# Patient Record
Sex: Female | Born: 1965
Health system: Southern US, Community
[De-identification: ages and names within clinical notes are randomized; demographics above are authoritative.]

## PROBLEM LIST (undated history)

## (undated) DIAGNOSIS — T7840XA Allergy, unspecified, initial encounter: Secondary | ICD-10-CM

## (undated) DIAGNOSIS — F329 Major depressive disorder, single episode, unspecified: Secondary | ICD-10-CM

## (undated) DIAGNOSIS — I10 Essential (primary) hypertension: Secondary | ICD-10-CM

## (undated) DIAGNOSIS — E7800 Pure hypercholesterolemia, unspecified: Secondary | ICD-10-CM

## (undated) DIAGNOSIS — F419 Anxiety disorder, unspecified: Secondary | ICD-10-CM

## (undated) DIAGNOSIS — R7303 Prediabetes: Secondary | ICD-10-CM

## (undated) DIAGNOSIS — M1990 Unspecified osteoarthritis, unspecified site: Secondary | ICD-10-CM

## (undated) DIAGNOSIS — F32A Depression, unspecified: Secondary | ICD-10-CM

## (undated) HISTORY — DX: Anxiety disorder, unspecified: F41.9

## (undated) HISTORY — DX: Allergy, unspecified, initial encounter: T78.40XA

## (undated) HISTORY — PX: TUBAL LIGATION: SHX77

## (undated) HISTORY — DX: Prediabetes: R73.03

## (undated) HISTORY — DX: Unspecified osteoarthritis, unspecified site: M19.90

---

## 2015-03-01 ENCOUNTER — Encounter (HOSPITAL_COMMUNITY): Payer: Self-pay | Admitting: Emergency Medicine

## 2015-03-01 ENCOUNTER — Emergency Department (HOSPITAL_COMMUNITY)
Admission: EM | Admit: 2015-03-01 | Discharge: 2015-03-01 | Disposition: A | Discharge status: Home / Self Care | Payer: Self-pay | Attending: Emergency Medicine | Admitting: Emergency Medicine

## 2015-03-01 ENCOUNTER — Emergency Department (HOSPITAL_COMMUNITY): Payer: Self-pay

## 2015-03-01 DIAGNOSIS — R0602 Shortness of breath: Secondary | ICD-10-CM | POA: Insufficient documentation

## 2015-03-01 DIAGNOSIS — I10 Essential (primary) hypertension: Secondary | ICD-10-CM | POA: Insufficient documentation

## 2015-03-01 DIAGNOSIS — Z8659 Personal history of other mental and behavioral disorders: Secondary | ICD-10-CM | POA: Insufficient documentation

## 2015-03-01 DIAGNOSIS — Z8639 Personal history of other endocrine, nutritional and metabolic disease: Secondary | ICD-10-CM | POA: Insufficient documentation

## 2015-03-01 DIAGNOSIS — Z87891 Personal history of nicotine dependence: Secondary | ICD-10-CM | POA: Insufficient documentation

## 2015-03-01 DIAGNOSIS — Z79899 Other long term (current) drug therapy: Secondary | ICD-10-CM | POA: Insufficient documentation

## 2015-03-01 DIAGNOSIS — R202 Paresthesia of skin: Secondary | ICD-10-CM | POA: Insufficient documentation

## 2015-03-01 HISTORY — DX: Major depressive disorder, single episode, unspecified: F32.9

## 2015-03-01 HISTORY — DX: Depression, unspecified: F32.A

## 2015-03-01 HISTORY — DX: Pure hypercholesterolemia, unspecified: E78.00

## 2015-03-01 HISTORY — DX: Essential (primary) hypertension: I10

## 2015-03-01 LAB — CBC WITH DIFFERENTIAL/PLATELET
BASOS ABS: 0 10*3/uL (ref 0.0–0.1)
BASOS PCT: 0 %
EOS PCT: 2 %
Eosinophils Absolute: 0.2 10*3/uL (ref 0.0–0.7)
HCT: 40.7 % (ref 36.0–46.0)
Hemoglobin: 13.2 g/dL (ref 12.0–15.0)
Lymphocytes Relative: 30 %
Lymphs Abs: 2.8 10*3/uL (ref 0.7–4.0)
MCH: 27.7 pg (ref 26.0–34.0)
MCHC: 32.4 g/dL (ref 30.0–36.0)
MCV: 85.5 fL (ref 78.0–100.0)
MONO ABS: 0.4 10*3/uL (ref 0.1–1.0)
MONOS PCT: 4 %
Neutro Abs: 5.9 10*3/uL (ref 1.7–7.7)
Neutrophils Relative %: 64 %
PLATELETS: 278 10*3/uL (ref 150–400)
RBC: 4.76 MIL/uL (ref 3.87–5.11)
RDW: 13.9 % (ref 11.5–15.5)
WBC: 9.3 10*3/uL (ref 4.0–10.5)

## 2015-03-01 LAB — COMPREHENSIVE METABOLIC PANEL
ALBUMIN: 4 g/dL (ref 3.5–5.0)
ALT: 18 U/L (ref 14–54)
ANION GAP: 7 (ref 5–15)
AST: 21 U/L (ref 15–41)
Alkaline Phosphatase: 57 U/L (ref 38–126)
BILIRUBIN TOTAL: 1.2 mg/dL (ref 0.3–1.2)
BUN: 8 mg/dL (ref 6–20)
CHLORIDE: 102 mmol/L (ref 101–111)
CO2: 27 mmol/L (ref 22–32)
Calcium: 9.1 mg/dL (ref 8.9–10.3)
Creatinine, Ser: 0.82 mg/dL (ref 0.44–1.00)
GFR calc Af Amer: >60 mL/min (ref >60)
GLUCOSE: 96 mg/dL (ref 65–99)
POTASSIUM: 3.9 mmol/L (ref 3.5–5.1)
Sodium: 136 mmol/L (ref 135–145)
TOTAL PROTEIN: 7 g/dL (ref 6.5–8.1)

## 2015-03-01 LAB — I-STAT TROPONIN, ED
TROPONIN I, POC: 0 ng/mL (ref 0.00–0.08)
TROPONIN I, POC: 0 ng/mL (ref 0.00–0.08)

## 2015-03-01 NOTE — Discharge Instructions (Signed)
Cough, Adult Coughing is a reflex that clears your throat and your airways. Coughing helps to heal and protect your lungs. It is normal to cough occasionally, but a cough that happens with other symptoms or lasts a long time may be a sign of a condition that needs treatment. A cough may last only 2-3 weeks (acute), or it may last longer than 8 weeks (chronic). CAUSES Coughing is commonly caused by:  Breathing in substances that irritate your lungs.  A viral or bacterial respiratory infection.  Allergies.  Asthma.  Postnasal drip.  Smoking.  Acid backing up from the stomach into the esophagus (gastroesophageal reflux).  Certain medicines.  Chronic lung problems, including COPD (or rarely, lung cancer).  Other medical conditions such as heart failure. HOME CARE INSTRUCTIONS  Pay attention to any changes in your symptoms. Take these actions to help with your discomfort:  Take medicines only as told by your health care provider.  If you were prescribed an antibiotic medicine, take it as told by your health care provider. Do not stop taking the antibiotic even if you start to feel better.  Talk with your health care provider before you take a cough suppressant medicine.  Drink enough fluid to keep your urine clear or pale yellow.  If the air is dry, use a cold steam vaporizer or humidifier in your bedroom or your home to help loosen secretions.  Avoid anything that causes you to cough at work or at home.  If your cough is worse at night, try sleeping in a semi-upright position.  Avoid cigarette smoke. If you smoke, quit smoking. If you need help quitting, ask your health care provider.  Avoid caffeine.  Avoid alcohol.  Rest as needed. SEEK MEDICAL CARE IF:   You have new symptoms.  You cough up pus.  Your cough does not get better after 2-3 weeks, or your cough gets worse.  You cannot control your cough with suppressant medicines and you are losing sleep.  You  develop pain that is getting worse or pain that is not controlled with pain medicines.  You have a fever.  You have unexplained weight loss.  You have night sweats. SEEK IMMEDIATE MEDICAL CARE IF:  You cough up blood.  You have difficulty breathing.  Your heartbeat is very fast.   This information is not intended to replace advice given to you by your health care provider. Make sure you discuss any questions you have with your health care provider.   Document Released: 10/17/2010 Document Revised: 01/09/2015 Document Reviewed: 06/27/2014 Elsevier Interactive Patient Education 2016 [place] [employer] Shortness of breath means you have trouble breathing. Shortness of breath needs medical care right away. HOME CARE   Do not smoke.  Avoid being around chemicals or things (paint fumes, dust) that may bother your breathing.  Rest as needed. Slowly begin your normal activities.  Only take medicines as told by your doctor.  Keep all doctor visits as told. GET HELP RIGHT AWAY IF:   Your shortness of breath gets worse.  You feel lightheaded, pass out (faint), or have a cough that is not helped by medicine.  You cough up blood.  You have pain with breathing.  You have pain in your chest, arms, shoulders, or belly (abdomen).  You have a fever.  You cannot walk up stairs or exercise the way you normally do.  You do not get better in the time expected.  You have a hard time doing normal  activities even with rest.  You have problems with your medicines.  You have any new symptoms. MAKE SURE YOU:  Understand these instructions.  Will watch your condition.  Will get help right away if you are not doing well or get worse.   This information is not intended to replace advice given to you by your health care provider. Make sure you discuss any questions you have with your health care provider.   Document Released: 10/07/2007 Document Revised: 04/25/2013  Document Reviewed: 07/06/2011 Elsevier Interactive Patient Education 2016 Elsevier Inc[employer]  Peripheral Neuropathy Peripheral neuropathy is a type of nerve damage. It affects nerves that carry signals between the spinal cord and other parts of the body. These are called peripheral nerves. With peripheral neuropathy, one nerve or a group of nerves may be damaged.  CAUSES  Many things can damage peripheral nerves. For some people with peripheral neuropathy, the cause is unknown. Some causes include:  Diabetes. This is the most common cause of peripheral neuropathy.  Injury to a nerve.  Pressure or stress on a nerve that lasts a long time.  Too little vitamin B. Alcoholism can lead to this.  Infections.  Autoimmune diseases, such as multiple sclerosis and systemic lupus erythematosus.  Inherited nerve diseases.  Some medicines, such as cancer drugs.  Toxic substances, such as lead and mercury.  Too little blood flowing to the legs.  Kidney disease.  Thyroid disease. SIGNS AND SYMPTOMS  Different people have different symptoms. The symptoms you have will depend on which of your nerves is damaged. Common symptoms include:  Loss of feeling (numbness) in the feet and hands.  Tingling in the feet and hands.  Pain that burns.  Very sensitive skin.  Weakness.  Not being able to move a part of the body (paralysis).  Muscle twitching.  Clumsiness or poor coordination.  Loss of balance.  Not being able to control your bladder.  Feeling dizzy.  Sexual problems. DIAGNOSIS  Peripheral neuropathy is a symptom, not a disease. Finding the cause of peripheral neuropathy can be hard. To figure that out, your health care provider will take a medical history and do a physical exam. A neurological exam will also be done. This involves checking things affected by your brain, spinal cord, and nerves (nervous system). For example, your health care provider will check your reflexes, how  you move, and what you can feel.  Other types of tests may also be ordered, such as:  Blood tests.  A test of the fluid in your spinal cord.  Imaging tests, such as CT scans or an MRI.  Electromyography (EMG). This test checks the nerves that control muscles.  Nerve conduction velocity tests. These tests check how fast messages pass through your nerves.  Nerve biopsy. A small piece of nerve is removed. It is then checked under a microscope. TREATMENT   Medicine is often used to treat peripheral neuropathy. Medicines may include:  Pain-relieving medicines. Prescription or over-the-counter medicine may be suggested.  Antiseizure medicine. This may be used for pain.  Antidepressants. These also may help ease pain from neuropathy.  Lidocaine. This is a numbing medicine. You might wear a patch or be given a shot.  Mexiletine. This medicine is typically used to help control irregular heart rhythms.  Surgery. Surgery may be needed to relieve pressure on a nerve or to destroy a nerve that is causing pain.  Physical therapy to help movement.  Assistive devices to help movement. HOME CARE INSTRUCTIONS  Only take over-the-counter or prescription medicines as directed by your health care provider. Follow the instructions carefully for any given medicines. Do not take any other medicines without first getting approval from your health care provider.  If you have diabetes, work closely with your health care provider to keep your blood sugar under control.  If you have numbness in your feet:  Check every day for signs of injury or infection. Watch for redness, warmth, and swelling.  Wear padded socks and comfortable shoes. These help protect your feet.  Do not do things that put pressure on your damaged nerve.  Do not smoke. Smoking keeps blood from getting to damaged nerves.  Avoid or limit alcohol. Too much alcohol can cause a lack of B vitamins. These vitamins are needed for  healthy nerves.  Develop a good support system. Coping with peripheral neuropathy can be stressful. Talk to a mental health specialist or join a support group if you are struggling.  Follow up with your health care provider as directed. SEEK MEDICAL CARE IF:   You have new signs or symptoms of peripheral neuropathy.  You are struggling emotionally from dealing with peripheral neuropathy.  You have a fever. SEEK IMMEDIATE MEDICAL CARE IF:   You have an injury or infection that is not healing.  You feel very dizzy or begin vomiting.  You have chest pain.  You have trouble breathing.   This information is not intended to replace advice given to you by your health care provider. Make sure you discuss any questions you have with your health care provider.   Document Released: 04/10/2002 Document Revised: 12/31/2010 Document Reviewed: 12/26/2012 Elsevier Interactive Patient Education [employer] Elsevier Inc.

## 2015-03-01 NOTE — ED Provider Notes (Signed)
Arrival Date & Time: 03/01/15 & 1225 History  HPI Limitations: none. Chief Complaint  Patient presents with  . Numbness   HPI Tara Walton is a 49 y.o. female with a chief complaint of Numbness  Patient presents for sensation of shortness of breath nausea and paresthesias in left arm that occurred approximately around 5 AM this morning. Woke up from sleep due to symptomatic endorsements. No emesis or diaphoresis. Upon arrival in the emergency department states that all symptoms have resolved except for mild shortness of breath. During my encounter with the patient patient had return of paresthesias over the distal left upper extremity in a ulnar distribution of the hand. States that chest pain occurs during inspiration. Patient longer smokes denies fevers or chills or cough.  Past Medical History  I reviewed & agree with nursing's documentation on PMHx, PSHx, SHx and FHx. Past Medical History  Diagnosis Date  . Hypertension   . Hypercholesteremia   . Depression    No past surgical history on file. Social History   Social History  . Marital Status: Married    Spouse Name: N/A  . Number of Children: N/A  . Years of Education: N/A   Social History Main Topics  . Smoking status: Former Smoker    Quit date: 02/29/2012  . Smokeless tobacco: Not on file  . Alcohol Use: Not on file  . Drug Use: Not on file  . Sexual Activity: Not on file   Other Topics Concern  . Not on file   Social History Narrative  . No narrative on file   No family history on file.  Review of Systems  Complete ROS obtained and pertinent positive and negatives documented above in HPI. All other ROS negative.  Allergies  Hydrochlorothiazide  Home Medications   Prior to Admission medications   Medication Sig Start Date End Date Taking? Authorizing Provider  lisinopril (PRINIVIL,ZESTRIL) 10 MG tablet Take 10 mg by mouth daily.   Yes Historical Provider, MD    Physical Exam  BP 115/67 mmHg  Pulse  70  Temp(Src) 98.1 F (36.7 C) (Oral)  Resp 14  Ht  (1.626 m)  Wt 190 lb (86.183 kg)  BMI 32.60 kg/m2  SpO2 97%  LMP 02/03/2015 (Approximate) Physical Exam  Constitutional: She is oriented to person, place, and time. She appears well-developed and well-nourished.  Non-toxic appearance. She does not appear ill. No distress.  HENT:  Head: Normocephalic and atraumatic.  Right Ear: External ear normal.  Left Ear: External ear normal.  Eyes: Pupils are equal, round, and reactive to light. No scleral icterus.  Neck: Normal range of motion. Neck supple. No tracheal deviation present.  Cardiovascular: Normal heart sounds and intact distal pulses.   No murmur heard. Pulmonary/Chest: Effort normal and breath sounds normal. No stridor. No respiratory distress. She has no wheezes. She has no rales.  Abdominal: Soft. Bowel sounds are normal. She exhibits no distension. There is no tenderness. There is no rebound and no guarding.  Musculoskeletal: Normal range of motion.  Neurological: She is alert and oriented to person, place, and time. She has normal strength and normal reflexes. No cranial nerve deficit or sensory deficit.  Paresthesias in ulnar distribution of left hand. Strength in all extremities 5/5.  Skin: Skin is warm and dry. No pallor.  Psychiatric: She has a normal mood and affect. Her behavior is normal.  Nursing note and vitals reviewed.  ED Course  Procedures Labs Review Labs Reviewed  CBC WITH DIFFERENTIAL/PLATELET  COMPREHENSIVE METABOLIC PANEL  I-STAT TROPOININ, ED  I-STAT TROPOININ, ED   Imaging Review No results found.  Laboratory and Imaging results were personally reviewed by myself and used in the medical decision making of this patient's treatment and disposition.  EKG Interpretation  EKG Interpretation  Date/Time:  Friday March 01 2015 15:26:07 EDT Ventricular Rate:  76 PR Interval:  156 QRS Duration: 83 QT Interval:  387 QTC Calculation: 435 R  Axis:   70 Text Interpretation:  Sinus rhythm Consider left atrial enlargement Low voltage, precordial leads ED PHYSICIAN INTERPRETATION AVAILABLE IN [facility] Confirmed by TEST, Record ([id number]) on 03/02/2015 10:39:30 AM      MDM  Tara Walton is a 49 y.o. female with H&P as above. ED clinical course as follows:  Patient with PMHx remarkable for HTN and HLD. Patient denies prior clotting disorders or ever DVT or PE and denies CAD.  Patient presents with endorsements atypical for cardiac etiology.  VS stable. Without evidence of cardiopulmonary instability.  Dissection unlikely given absence of remarkable neurovascular deficits. Paresthesias fits ulnar neuropathy distribution. No other neurologic findings that would suggest Intracranial etiology and patient is hemodynamic stabile. No prior CVA/TIA hx.   Patient's mental status is appropriate. Cranial nerves remain grossly intact. Baseline strength and sensation in upper and lower extremities symmetrically. Coordination and gait intact. Therefore do not suspect Neurologic etiology. TIA unlikely due to symptoms not following vascular distribution.   Abdominal exam reveals no concerns, do not suspect as etiology.  Initial ECG reveals Sinus rhythm Consider left atrial enlargement without evidence of STEMI or STEMI equivalent.  Troponin sent, which were negative x 2.  Labs reveal no acute abnormalities requiring intervention or that would be the likely culprit of today's symptoms.  My visualization of Imaging revealed no evidence of acute cardiopulmonary disease. Specifically no PTX or evidence of PNA.  Patient does not have clinical signs or symptoms of DVT. I do not believe PE is the most likely diagnosis, nor do I believe PE is equally as likely in this patient's case. The patient's heart rate is below 100, the patient has not been immobilized, and the patient has not had surgery in the previous 4 weeks. The patient is not displaying  hemoptysis and has not suffered from a malignancy within the last 6 months. Therefore will not obtain d dimer at this time.  Patient required no interventions.  ACS/MI, PTX, PNA, Aortic Dissection, Pulmonary Embolism and CVA/TIA were considered in the evaluation of the patient during their encounter today.  I explained the differential diagnoses along with likely diagnosis and have given explicit precautions to return to the ER including any other new or worsening symptoms. The patient understands, and I both nursing and I confirmed there are no further concerns or questions. Discharge instructions concerning symptomatic care and follow up have been given. The patient is STABLE and is discharged to home in good condition.   Clinical Impression:  1. Paresthesia   2. SOB (shortness of breath)    Patient care discussed with Dr. Dalene SeltzerSchlossman, who oversaw their evaluation & treatment & voiced agreement. House Officer: Jonette EvaBrad Arlena Marsan, MD, Emergency Medicine.  Jonette EvaBrad Aaryn Parrilla, MD 03/04/15 [id number]  Alvira MondayErin Schlossman, MD 03/04/15 [phone]

## 2015-03-01 NOTE — ED Notes (Signed)
Patient transported to X-ray without distress.  

## 2015-03-01 NOTE — ED Notes (Addendum)
PT feeling SOB and nauseated; started around 0500 this morning woke her up from sleep; believes it to be panic attack. Left arm became numb at that time and felt nauseated. No vomiting. States it comes and goes. Felt chest pain upon deep inspiration; former smoker; denies fevers. States she has had many stressors lately with family.

## 2016-02-05 ENCOUNTER — Encounter: Payer: Self-pay | Admitting: Emergency Medicine

## 2016-02-05 ENCOUNTER — Emergency Department (HOSPITAL_COMMUNITY)
Admission: EM | Admit: 2016-02-05 | Discharge: 2016-02-05 | Disposition: A | Discharge status: Home / Self Care | Payer: Self-pay | Attending: Emergency Medicine | Admitting: Emergency Medicine

## 2016-02-05 DIAGNOSIS — Z87891 Personal history of nicotine dependence: Secondary | ICD-10-CM | POA: Insufficient documentation

## 2016-02-05 DIAGNOSIS — E876 Hypokalemia: Secondary | ICD-10-CM

## 2016-02-05 DIAGNOSIS — I10 Essential (primary) hypertension: Secondary | ICD-10-CM | POA: Insufficient documentation

## 2016-02-05 LAB — CBC WITH DIFFERENTIAL/PLATELET
BASOS ABS: 0 10*3/uL (ref 0.0–0.1)
BASOS PCT: 0 %
EOS ABS: 0.2 10*3/uL (ref 0.0–0.7)
EOS PCT: 1 %
HCT: 41.2 % (ref 36.0–46.0)
Hemoglobin: 13.6 g/dL (ref 12.0–15.0)
LYMPHS PCT: 24 %
Lymphs Abs: 3 10*3/uL (ref 0.7–4.0)
MCH: 28.2 pg (ref 26.0–34.0)
MCHC: 33 g/dL (ref 30.0–36.0)
MCV: 85.3 fL (ref 78.0–100.0)
MONO ABS: 0.6 10*3/uL (ref 0.1–1.0)
Monocytes Relative: 5 %
Neutro Abs: 8.4 10*3/uL — ABNORMAL HIGH (ref 1.7–7.7)
Neutrophils Relative %: 70 %
PLATELETS: 305 10*3/uL (ref 150–400)
RBC: 4.83 MIL/uL (ref 3.87–5.11)
RDW: 14 % (ref 11.5–15.5)
WBC: 12.2 10*3/uL — ABNORMAL HIGH (ref 4.0–10.5)

## 2016-02-05 LAB — COMPREHENSIVE METABOLIC PANEL
ALBUMIN: 3.7 g/dL (ref 3.5–5.0)
ALT: 26 U/L (ref 14–54)
AST: 23 U/L (ref 15–41)
Alkaline Phosphatase: 78 U/L (ref 38–126)
Anion gap: 12 (ref 5–15)
BUN: 17 mg/dL (ref 6–20)
CHLORIDE: 94 mmol/L — AB (ref 101–111)
CO2: 31 mmol/L (ref 22–32)
Calcium: 9.3 mg/dL (ref 8.9–10.3)
Creatinine, Ser: 1.13 mg/dL — ABNORMAL HIGH (ref 0.44–1.00)
GFR calc Af Amer: >60 mL/min (ref >60)
GFR, EST NON AFRICAN AMERICAN: 56 mL/min — AB (ref >60)
Glucose, Bld: 143 mg/dL — ABNORMAL HIGH (ref 65–99)
POTASSIUM: 2.5 mmol/L — AB (ref 3.5–5.1)
SODIUM: 137 mmol/L (ref 135–145)
Total Bilirubin: 0.9 mg/dL (ref 0.3–1.2)
Total Protein: 6.4 g/dL — ABNORMAL LOW (ref 6.5–8.1)

## 2016-02-05 MED ORDER — POTASSIUM CHLORIDE 10 MEQ/100ML IV SOLN
10.0000 meq | Freq: Once | INTRAVENOUS | Status: AC
  Administered 2016-02-05: 10 meq via INTRAVENOUS
  Filled 2016-02-05: qty 100

## 2016-02-05 MED ORDER — POTASSIUM CHLORIDE CRYS ER 20 MEQ PO TBCR: 20.0000 meq | EXTENDED_RELEASE_TABLET | Freq: Two times a day (BID) | ORAL | 0 refills | Status: DC

## 2016-02-05 MED ORDER — POTASSIUM CHLORIDE CRYS ER 20 MEQ PO TBCR
40.0000 meq | EXTENDED_RELEASE_TABLET | Freq: Once | ORAL | Status: AC
  Administered 2016-02-05: 40 meq via ORAL
  Filled 2016-02-05: qty 2

## 2016-02-05 MED ORDER — SODIUM CHLORIDE 0.9 % IV BOLUS (SEPSIS)
1000.0000 mL | Freq: Once | INTRAVENOUS | Status: AC
  Administered 2016-02-05: 1000 mL via INTRAVENOUS

## 2016-02-05 NOTE — ED Notes (Signed)
Denies concerns with dc 

## 2016-02-05 NOTE — ED Provider Notes (Signed)
[facility]-EMERGENCY DEPT Provider Note   CSN: [id number] Arrival date & time: 02/05/16  1316     History   Chief Complaint Chief Complaint  Patient presents with  . Fatigue    HPI Tara Walton is a 50 y.o. female.  Patient is a 50 year old female with history of hypertension. She presents for evaluation of weakness. She reports increased fatigue, having no energy, her legs cramping, and feeling poorly. She was seen last week by her PCP and had changes made to her blood pressure medications. She had previously been on lisinopril and this was changed to chlorthalidone. Since taking this medication she has developed the above symptoms. She denies any chest pain or shortness of breath. She denies any nausea, vomiting, or diarrhea. She denies any fevers or chills. She has since gone back to taking her lisinopril.   The history is provided by the patient.    Past Medical History:  Diagnosis Date  . Depression   . Hypercholesteremia   . Hypertension     There are no active problems to display for this patient.   No past surgical history on file.  OB History    No data available       Home Medications    Prior to Admission medications   Medication Sig Start Date End Date Taking? Authorizing Provider  lisinopril (PRINIVIL,ZESTRIL) 10 MG tablet Take 10 mg by mouth daily.    Historical Provider, MD    Family History No family history on file.  Social History Social History  Substance Use Topics  . Smoking status: Former Smoker    Quit date: 02/29/2012  . Smokeless tobacco: Not on file  . Alcohol use Not on file     Allergies   Hydrochlorothiazide   Review of Systems Review of Systems  All other systems reviewed and are negative.    Physical Exam Updated Vital Signs BP 118/60 (BP Location: Right Arm)   Pulse 75   Temp 97.5 F (36.4 C) (Oral)   SpO2 99%   Physical Exam  Constitutional: She is oriented to person, place, and time. She appears  well-developed and well-nourished. No distress.  HENT:  Head: Normocephalic and atraumatic.  Neck: Normal range of motion. Neck supple.  Cardiovascular: Normal rate and regular rhythm.  Exam reveals no gallop and no friction rub.   No murmur heard. Pulmonary/Chest: Effort normal and breath sounds normal. No respiratory distress. She has no wheezes.  Abdominal: Soft. Bowel sounds are normal. She exhibits no distension. There is no tenderness.  Musculoskeletal: Normal range of motion.  Neurological: She is alert and oriented to person, place, and time.  Skin: Skin is warm and dry. She is not diaphoretic.  Nursing note and vitals reviewed.    ED Treatments / Results  Labs (all labs ordered are listed, but only abnormal results are displayed) Labs Reviewed  CBC WITH DIFFERENTIAL/PLATELET - Abnormal; Notable for the following:       Result Value   WBC 12.2 (*)    Neutro Abs 8.4 (*)    All other components within normal limits  COMPREHENSIVE METABOLIC PANEL - Abnormal; Notable for the following:    Potassium 2.5 (*)    Chloride 94 (*)    Glucose, Bld 143 (*)    Creatinine, Ser 1.13 (*)    Total Protein 6.4 (*)    GFR calc non Af Amer 56 (*)    All other components within normal limits  URINALYSIS, ROUTINE W REFLEX MICROSCOPIC (NOT AT  [facility])    EKG  EKG Interpretation None       Radiology No results found.  Procedures Procedures (including critical care time)  Medications Ordered in ED Medications  sodium chloride 0.9 % bolus 1,000 mL (not administered)  potassium chloride 10 mEq in 100 mL IVPB (not administered)  potassium chloride 10 mEq in 100 mL IVPB (not administered)  potassium chloride SA (K-DUR,KLOR-CON) CR tablet 40 mEq (not administered)     Initial Impression / Assessment and Plan / ED Course  I have reviewed the triage vital signs and the nursing notes.  Pertinent labs & imaging results that were available during my care of the patient were reviewed by  me and considered in my medical decision making (see chart for details).  Clinical Course    Workup reveals a potassium of 2.5. She was given IV and oral potassium here in the ER and will be discharged with a short course of oral potassium at home. She is to follow-up with her primary Dr. in one week to have this level rechecked, and return to the ER if symptoms significantly worsen or change.  Final Clinical Impressions(s) / ED Diagnoses   Final diagnoses:  None    New Prescriptions New Prescriptions   No medications on file     Geoffery Lyons, MD 02/05/16 1713

## 2016-02-05 NOTE — ED Triage Notes (Addendum)
Pt c/o feeling weak and tired, recently had BP meds changed from lisinopril to chlorthalidone 1 week ago-- pt thinks that the med change has caused her to feel bad-- did take her lisinopril this am in stead of new med.   Pt has a rash on left lower side area. No blisters noted

## 2016-02-05 NOTE — Discharge Instructions (Signed)
Potassium as prescribed.  His follow-up with your primary Dr. in one week for a recheck of your potassium level, and return to the emergency department if your symptoms significantly worsen in the meantime.

## 2016-02-05 NOTE — ED Notes (Signed)
Reports generalized fatigue

## 2016-07-04 ENCOUNTER — Encounter (HOSPITAL_COMMUNITY): Payer: Self-pay | Admitting: *Deleted

## 2016-07-04 ENCOUNTER — Emergency Department (HOSPITAL_COMMUNITY)
Admission: EM | Admit: 2016-07-04 | Discharge: 2016-07-04 | Disposition: A | Discharge status: Home / Self Care | Payer: Self-pay | Attending: Emergency Medicine | Admitting: Emergency Medicine

## 2016-07-04 DIAGNOSIS — Z79899 Other long term (current) drug therapy: Secondary | ICD-10-CM | POA: Insufficient documentation

## 2016-07-04 DIAGNOSIS — I10 Essential (primary) hypertension: Secondary | ICD-10-CM | POA: Insufficient documentation

## 2016-07-04 DIAGNOSIS — Z87891 Personal history of nicotine dependence: Secondary | ICD-10-CM | POA: Insufficient documentation

## 2016-07-04 DIAGNOSIS — R1013 Epigastric pain: Secondary | ICD-10-CM | POA: Insufficient documentation

## 2016-07-04 DIAGNOSIS — Z7982 Long term (current) use of aspirin: Secondary | ICD-10-CM | POA: Insufficient documentation

## 2016-07-04 LAB — COMPREHENSIVE METABOLIC PANEL
ALK PHOS: 65 U/L (ref 38–126)
ALT: 28 U/L (ref 14–54)
ANION GAP: 8 (ref 5–15)
AST: 23 U/L (ref 15–41)
Albumin: 4.1 g/dL (ref 3.5–5.0)
BILIRUBIN TOTAL: 0.8 mg/dL (ref 0.3–1.2)
BUN: 9 mg/dL (ref 6–20)
CALCIUM: 9.9 mg/dL (ref 8.9–10.3)
CO2: 25 mmol/L (ref 22–32)
Chloride: 105 mmol/L (ref 101–111)
Creatinine, Ser: 0.75 mg/dL (ref 0.44–1.00)
Glucose, Bld: 99 mg/dL (ref 65–99)
Potassium: 3.9 mmol/L (ref 3.5–5.1)
SODIUM: 138 mmol/L (ref 135–145)
TOTAL PROTEIN: 7.1 g/dL (ref 6.5–8.1)

## 2016-07-04 LAB — CBC
HCT: 41.7 % (ref 36.0–46.0)
HEMOGLOBIN: 13.7 g/dL (ref 12.0–15.0)
MCH: 28.4 pg (ref 26.0–34.0)
MCHC: 32.9 g/dL (ref 30.0–36.0)
MCV: 86.3 fL (ref 78.0–100.0)
PLATELETS: 259 10*3/uL (ref 150–400)
RBC: 4.83 MIL/uL (ref 3.87–5.11)
RDW: 14.4 % (ref 11.5–15.5)
WBC: 8.8 10*3/uL (ref 4.0–10.5)

## 2016-07-04 LAB — URINALYSIS, ROUTINE W REFLEX MICROSCOPIC
Bilirubin Urine: NEGATIVE
GLUCOSE, UA: NEGATIVE mg/dL
KETONES UR: NEGATIVE mg/dL
NITRITE: NEGATIVE
Protein, ur: NEGATIVE mg/dL
Specific Gravity, Urine: 1.017 (ref 1.005–1.030)
pH: 5 (ref 5.0–8.0)

## 2016-07-04 LAB — LIPASE, BLOOD: Lipase: 15 U/L (ref 11–51)

## 2016-07-04 MED ORDER — OMEPRAZOLE 20 MG PO CPDR
20.0000 mg | DELAYED_RELEASE_CAPSULE | Freq: Every day | ORAL | 0 refills | Status: DC
Start: 2016-07-04 — End: 2022-12-17

## 2016-07-04 NOTE — ED Provider Notes (Signed)
[facility] Provider Note   CSN: [id number] Arrival date & time: 07/04/16  1151     History   Chief Complaint Chief Complaint  Patient presents with  . Abdominal Pain    HPI Tara Walton is a 51 y.o. female.  HPI Patient with abdominal pain. Has had it for the last week. Worse after eating. In her epigastric area. No nausea vomiting diarrhea. No fevers. No weight loss. No change in stool. Pain goes away between. No diaphoresis. She has not had pains like this before.   Past Medical History:  Diagnosis Date  . Depression   . Hypercholesteremia   . Hypertension     There are no active problems to display for this patient.   History reviewed. No pertinent surgical history.  OB History    No data available       Home Medications    Prior to Admission medications   Medication Sig Start Date End Date Taking? Authorizing Provider  aspirin 81 MG chewable tablet Chew 81 mg by mouth daily.   Yes Historical Provider, MD  lisinopril (PRINIVIL,ZESTRIL) 20 MG tablet Take 20 mg by mouth daily.   Yes Historical Provider, MD  omeprazole (PRILOSEC) 20 MG capsule Take 1 capsule (20 mg total) by mouth daily. 07/04/16   Benjiman CoreNathan Kinnick Maus, MD    Family History No family history on file.  Social History Social History  Substance Use Topics  . Smoking status: Former Smoker    Quit date: 02/29/2012  . Smokeless tobacco: Never Used  . Alcohol use No     Allergies   Hydrochlorothiazide   Review of Systems Review of Systems  Constitutional: Negative for appetite change.  HENT: Negative for congestion.   Gastrointestinal: Positive for abdominal pain, nausea and vomiting. Negative for constipation and diarrhea.  Genitourinary: Negative for flank pain.  Musculoskeletal: Negative for back pain.  Neurological: Negative for light-headedness.  Psychiatric/Behavioral: Negative for agitation.     Physical Exam Updated Vital Signs BP 157/84 (BP Location: Left Arm)    Pulse 78   Temp 97.8 F (36.6 C) (Oral)   Resp 18   Ht 5\' 5"  (1.651 m)   Wt 200 lb (90.7 kg)   SpO2 98%   BMI 33.28 kg/m   Physical Exam  Constitutional: She appears well-developed.  HENT:  Head: Normocephalic.  Eyes: Pupils are equal, round, and reactive to light.  Neck: Neck supple.  Cardiovascular: Normal rate.   Pulmonary/Chest: Effort normal.  Abdominal: There is tenderness.  Epigastric tenderness without rebound or guarding.  Musculoskeletal: She exhibits no edema.  Neurological: She is alert.  Skin: Skin is warm. Capillary refill takes less than 2 seconds.     ED Treatments / Results  Labs (all labs ordered are listed, but only abnormal results are displayed) Labs Reviewed  URINALYSIS, ROUTINE W REFLEX MICROSCOPIC - Abnormal; Notable for the following:       Result Value   APPearance CLOUDY (*)    Hgb urine dipstick SMALL (*)    Leukocytes, UA MODERATE (*)    Bacteria, UA FEW (*)    Squamous Epithelial / LPF 6-30 (*)    All other components within normal limits  LIPASE, BLOOD  COMPREHENSIVE METABOLIC PANEL  CBC    EKG  EKG Interpretation None       Radiology No results found.  Procedures Procedures (including critical care time)  Medications Ordered in ED Medications - No data to display   Initial Impression / Assessment and Plan /  ED Course  I have reviewed the triage vital signs and the nursing notes.  Pertinent labs & imaging results that were available during my care of the patient were reviewed by me and considered in my medical decision making (see chart for details).     Patient with epigastric abdominal pain. Worse after eating. No right upper quadrant tenderness. Labs reassuring. Discussed with patient. CT scan versus conservative treatment with omeprazole and follow-up. Patient has elected this time for the follow-up. Will have GI follow-up  Final Clinical Impressions(s) / ED Diagnoses   Final diagnoses:  Epigastric abdominal  pain    New Prescriptions New Prescriptions   OMEPRAZOLE (PRILOSEC) 20 MG CAPSULE    Take 1 capsule (20 mg total) by mouth daily.     Benjiman Core, MD 07/04/16 1630

## 2016-07-04 NOTE — ED Triage Notes (Signed)
Pt c/o abd pain onset x 1 wk, pt denies v/d, pt c/o nausea, pt denies GI hx, A&O x4

## 2017-01-06 DIAGNOSIS — N879 Dysplasia of cervix uteri, unspecified: Secondary | ICD-10-CM | POA: Insufficient documentation

## 2017-09-13 DIAGNOSIS — Z0000 Encounter for general adult medical examination without abnormal findings: Secondary | ICD-10-CM | POA: Diagnosis not present

## 2017-09-13 DIAGNOSIS — I10 Essential (primary) hypertension: Secondary | ICD-10-CM | POA: Diagnosis not present

## 2017-09-13 DIAGNOSIS — E119 Type 2 diabetes mellitus without complications: Secondary | ICD-10-CM | POA: Diagnosis not present

## 2017-09-13 DIAGNOSIS — E039 Hypothyroidism, unspecified: Secondary | ICD-10-CM | POA: Diagnosis not present

## 2017-09-13 DIAGNOSIS — E7800 Pure hypercholesterolemia, unspecified: Secondary | ICD-10-CM | POA: Diagnosis not present

## 2017-09-13 DIAGNOSIS — Z139 Encounter for screening, unspecified: Secondary | ICD-10-CM | POA: Diagnosis not present

## 2017-10-22 DIAGNOSIS — R52 Pain, unspecified: Secondary | ICD-10-CM | POA: Diagnosis not present

## 2017-10-22 DIAGNOSIS — M7732 Calcaneal spur, left foot: Secondary | ICD-10-CM | POA: Diagnosis not present

## 2017-12-10 ENCOUNTER — Ambulatory Visit (INDEPENDENT_AMBULATORY_CARE_PROVIDER_SITE_OTHER): Payer: BLUE CROSS/BLUE SHIELD

## 2017-12-10 ENCOUNTER — Ambulatory Visit (INDEPENDENT_AMBULATORY_CARE_PROVIDER_SITE_OTHER): Payer: BLUE CROSS/BLUE SHIELD | Admitting: Orthopaedic Surgery

## 2017-12-10 ENCOUNTER — Encounter (INDEPENDENT_AMBULATORY_CARE_PROVIDER_SITE_OTHER): Payer: Self-pay | Admitting: Orthopaedic Surgery

## 2017-12-10 VITALS — BP 157/82 | HR 74 | Ht 65.0 in | Wt 214.0 lb

## 2017-12-10 DIAGNOSIS — M25572 Pain in left ankle and joints of left foot: Secondary | ICD-10-CM

## 2017-12-10 MED ORDER — METHYLPREDNISOLONE ACETATE 40 MG/ML IJ SUSP
40.0000 mg | INTRAMUSCULAR | Status: AC | PRN
  Administered 2017-12-10: 40 mg

## 2017-12-10 MED ORDER — LIDOCAINE HCL 1 % IJ SOLN
1.0000 mL | INTRAMUSCULAR | Status: AC | PRN
  Administered 2017-12-10: 1 mL

## 2017-12-10 MED ORDER — BUPIVACAINE HCL 0.5 % IJ SOLN
1.0000 mL | INTRAMUSCULAR | Status: AC | PRN
  Administered 2017-12-10: 1 mL

## 2017-12-10 NOTE — Progress Notes (Signed)
Office Visit Note   Patient: Tara Walton           Date of Birth: 11-09-1965           MRN: [id number] Visit Date: 12/10/2017              Requested by: Malva Limes, MD [address] [place], [place] [address] PCP: Malva Limes, MD   Assessment & Plan: Visit Diagnoses:  1. Pain in left ankle and joints of left foot     Plan: Chronic plantar fasciitis left foot.  Long discussion regarding diagnosis and treatment options.  Will inject with cortisone and monitor response.  Return as needed.  Continue with icing stretching and comfortable shoes  Follow-Up Instructions: Return if symptoms worsen or fail to improve.   Orders:  Orders Placed This Encounter  Procedures  . Foot Inj  . XR Foot Complete Left   No orders of the defined types were placed in this encounter.     Procedures: Foot Inj Date/Time: 12/10/2017 9:17 AM Performed by: Valeria Batman, MD Authorized by: Valeria Batman, MD   Consent Given by:  Patient Condition: Plantar Fasciitis   Location: left plantar fascia muscle   Medications:  1 mL lidocaine 1 %; 1 mL bupivacaine 0.5 %; 40 mg methylPREDNISolone acetate 40 MG/ML     Clinical Data: No additional findings.   Subjective: Chief Complaint  Patient presents with  . New Patient (Initial Visit)    L FOOT PAIN SINCE FEB STATED WORKING STANDING ON FOOT ON BOTTOM OF FOOT USING ACE BANDAGE  Tara Walton relates insidious onset of left heel pain approximately 6 months ago.  No history of injury or trauma.  Her job requires her to stand most of the day.  The pain is been localized on the plantar aspect of her heel.  She is aware that she might of plantar fasciitis and has been wearing comfortable shoes, icing at night and stretching.  Still having considerable pain to the point of compromise.  HPI  Review of Systems  Constitutional: Negative for fatigue and fever.  HENT: Negative for ear pain.   Eyes: Negative for pain.    Respiratory: Negative for cough and shortness of breath.   Cardiovascular: Positive for leg swelling.  Gastrointestinal: Negative for constipation and diarrhea.  Genitourinary: Negative for difficulty urinating.  Musculoskeletal: Negative for back pain and neck pain.  Skin: Negative for rash.  Allergic/Immunologic: Negative for food allergies.  Neurological: Positive for weakness. Negative for numbness.  Hematological: Does not bruise/bleed easily.  Psychiatric/Behavioral: Negative for sleep disturbance.     Objective: Vital Signs: BP (!) 157/82 (BP Location: Left Arm, Patient Position: Sitting, Cuff Size: Normal)   Pulse 74   Ht 5\' 5"  (1.651 m)   Wt 214 lb (97.1 kg)   BMI 35.61 kg/m   Physical Exam  Constitutional: She is oriented to person, place, and time. She appears well-developed and well-nourished.  HENT:  Mouth/Throat: Oropharynx is clear and moist.  Eyes: Pupils are equal, round, and reactive to light. EOM are normal.  Pulmonary/Chest: Effort normal.  Neurological: She is alert and oriented to person, place, and time.  Skin: Skin is warm and dry.  Psychiatric: She has a normal mood and affect. Her behavior is normal.    Ortho Exam awake alert and oriented x3.  Comfortable sitting.  Very pleasant.  Pain is localized to the plantar aspect of the left foot directly over the plantar fascial  insertion on the os calcis.  No skin changes.  No masses.  No pain on the arch.  No pain dorsally at the midfoot or forefoot.  No pain along the Achilles tendon.  Neurovascular exam intact  Specialty Comments:  No specialty comments available.  Imaging: Xr Foot Complete Left  Result Date: 12/10/2017 The left foot were obtained in several projections.  There is a very small plantar heel spur where the patient is symptomatic.  There is a very minimal calcification of the insertion of the Achilles tendon without a significant spur.  No pain in that area with no acute changes.  Prominent  navicular with an accessory ossicle that also was not symptomatic    PMFS History: There are no active problems to display for this patient.  Past Medical History:  Diagnosis Date  . Depression   . Hypercholesteremia   . Hypertension     History reviewed. No pertinent family history.  History reviewed. No pertinent surgical history. Social History   Occupational History  . Not on file  Tobacco Use  . Smoking status: Former Smoker    Last attempt to quit: 02/29/2012    Years since quitting: 5.7  . Smokeless tobacco: Never Used  Substance and Sexual Activity  . Alcohol use: No  . Drug use: No  . Sexual activity: Not on file

## 2018-06-10 DIAGNOSIS — E119 Type 2 diabetes mellitus without complications: Secondary | ICD-10-CM | POA: Diagnosis not present

## 2018-06-10 DIAGNOSIS — E7800 Pure hypercholesterolemia, unspecified: Secondary | ICD-10-CM | POA: Diagnosis not present

## 2018-06-10 DIAGNOSIS — E039 Hypothyroidism, unspecified: Secondary | ICD-10-CM | POA: Diagnosis not present

## 2018-06-10 DIAGNOSIS — Z0000 Encounter for general adult medical examination without abnormal findings: Secondary | ICD-10-CM | POA: Diagnosis not present

## 2018-06-10 DIAGNOSIS — I10 Essential (primary) hypertension: Secondary | ICD-10-CM | POA: Diagnosis not present

## 2018-10-31 DIAGNOSIS — B373 Candidiasis of vulva and vagina: Secondary | ICD-10-CM | POA: Diagnosis not present

## 2018-10-31 DIAGNOSIS — L299 Pruritus, unspecified: Secondary | ICD-10-CM | POA: Diagnosis not present

## 2018-11-18 DIAGNOSIS — L299 Pruritus, unspecified: Secondary | ICD-10-CM | POA: Diagnosis not present

## 2018-11-18 DIAGNOSIS — R52 Pain, unspecified: Secondary | ICD-10-CM | POA: Diagnosis not present

## 2018-11-18 DIAGNOSIS — N760 Acute vaginitis: Secondary | ICD-10-CM | POA: Diagnosis not present

## 2019-02-23 DIAGNOSIS — I10 Essential (primary) hypertension: Secondary | ICD-10-CM | POA: Diagnosis not present

## 2019-02-23 DIAGNOSIS — R7303 Prediabetes: Secondary | ICD-10-CM | POA: Diagnosis not present

## 2019-02-23 DIAGNOSIS — E785 Hyperlipidemia, unspecified: Secondary | ICD-10-CM | POA: Diagnosis not present

## 2019-02-23 DIAGNOSIS — L290 Pruritus ani: Secondary | ICD-10-CM | POA: Diagnosis not present

## 2019-02-23 DIAGNOSIS — Z1331 Encounter for screening for depression: Secondary | ICD-10-CM | POA: Diagnosis not present

## 2019-03-10 DIAGNOSIS — L290 Pruritus ani: Secondary | ICD-10-CM | POA: Diagnosis not present

## 2019-03-10 DIAGNOSIS — Z0000 Encounter for general adult medical examination without abnormal findings: Secondary | ICD-10-CM | POA: Diagnosis not present

## 2019-03-10 DIAGNOSIS — Z2821 Immunization not carried out because of patient refusal: Secondary | ICD-10-CM | POA: Diagnosis not present

## 2019-03-10 DIAGNOSIS — Z1211 Encounter for screening for malignant neoplasm of colon: Secondary | ICD-10-CM | POA: Diagnosis not present

## 2019-04-30 DIAGNOSIS — U071 COVID-19: Secondary | ICD-10-CM | POA: Diagnosis not present

## 2019-04-30 DIAGNOSIS — J209 Acute bronchitis, unspecified: Secondary | ICD-10-CM | POA: Diagnosis not present

## 2019-04-30 DIAGNOSIS — R091 Pleurisy: Secondary | ICD-10-CM | POA: Diagnosis not present

## 2019-04-30 DIAGNOSIS — Z20828 Contact with and (suspected) exposure to other viral communicable diseases: Secondary | ICD-10-CM | POA: Diagnosis not present

## 2019-05-08 DIAGNOSIS — J209 Acute bronchitis, unspecified: Secondary | ICD-10-CM | POA: Diagnosis not present

## 2019-07-24 DIAGNOSIS — Z87891 Personal history of nicotine dependence: Secondary | ICD-10-CM | POA: Diagnosis not present

## 2019-07-24 DIAGNOSIS — E785 Hyperlipidemia, unspecified: Secondary | ICD-10-CM | POA: Diagnosis not present

## 2019-07-24 DIAGNOSIS — I10 Essential (primary) hypertension: Secondary | ICD-10-CM | POA: Diagnosis not present

## 2019-07-24 DIAGNOSIS — R1013 Epigastric pain: Secondary | ICD-10-CM | POA: Diagnosis not present

## 2019-07-24 DIAGNOSIS — R42 Dizziness and giddiness: Secondary | ICD-10-CM | POA: Diagnosis not present

## 2019-07-24 DIAGNOSIS — R110 Nausea: Secondary | ICD-10-CM | POA: Diagnosis not present

## 2019-08-02 DIAGNOSIS — R1906 Epigastric swelling, mass or lump: Secondary | ICD-10-CM | POA: Diagnosis not present

## 2019-08-02 DIAGNOSIS — R7303 Prediabetes: Secondary | ICD-10-CM | POA: Diagnosis not present

## 2019-08-02 DIAGNOSIS — H8110 Benign paroxysmal vertigo, unspecified ear: Secondary | ICD-10-CM | POA: Diagnosis not present

## 2019-08-02 DIAGNOSIS — K5900 Constipation, unspecified: Secondary | ICD-10-CM | POA: Diagnosis not present

## 2019-08-30 DIAGNOSIS — R7303 Prediabetes: Secondary | ICD-10-CM | POA: Diagnosis not present

## 2019-08-30 DIAGNOSIS — I10 Essential (primary) hypertension: Secondary | ICD-10-CM | POA: Diagnosis not present

## 2019-08-30 DIAGNOSIS — E785 Hyperlipidemia, unspecified: Secondary | ICD-10-CM | POA: Diagnosis not present

## 2019-09-22 DIAGNOSIS — Z20828 Contact with and (suspected) exposure to other viral communicable diseases: Secondary | ICD-10-CM | POA: Diagnosis not present

## 2019-09-22 DIAGNOSIS — J3489 Other specified disorders of nose and nasal sinuses: Secondary | ICD-10-CM | POA: Diagnosis not present

## 2020-01-01 DIAGNOSIS — I10 Essential (primary) hypertension: Secondary | ICD-10-CM | POA: Diagnosis not present

## 2020-01-01 DIAGNOSIS — R7303 Prediabetes: Secondary | ICD-10-CM | POA: Diagnosis not present

## 2020-01-01 DIAGNOSIS — E785 Hyperlipidemia, unspecified: Secondary | ICD-10-CM | POA: Diagnosis not present

## 2020-01-01 DIAGNOSIS — Z1159 Encounter for screening for other viral diseases: Secondary | ICD-10-CM | POA: Diagnosis not present

## 2020-07-02 ENCOUNTER — Encounter: Payer: Self-pay | Admitting: Emergency Medicine

## 2020-07-02 ENCOUNTER — Ambulatory Visit
Admission: EM | Admit: 2020-07-02 | Discharge: 2020-07-02 | Disposition: A | Discharge status: Home / Self Care | Payer: BLUE CROSS/BLUE SHIELD | Attending: Urgent Care | Admitting: Urgent Care

## 2020-07-02 ENCOUNTER — Other Ambulatory Visit: Payer: Self-pay

## 2020-07-02 ENCOUNTER — Ambulatory Visit (INDEPENDENT_AMBULATORY_CARE_PROVIDER_SITE_OTHER): Payer: BLUE CROSS/BLUE SHIELD

## 2020-07-02 DIAGNOSIS — M25561 Pain in right knee: Secondary | ICD-10-CM

## 2020-07-02 MED ORDER — PREDNISONE 20 MG PO TABS: ORAL_TABLET | ORAL | 0 refills | Status: DC

## 2020-07-02 NOTE — ED Provider Notes (Signed)
[facility]   MRN: [id number] DOB: [birth year]  Subjective:   Tara Walton is a 55 y.o. female presenting for 1 week history of persistent right knee pain, difficulty bending and bearing weight/walking. Denies fall, trauma, redness, swelling. No calf pain, redness, or swelling either. No history of arthritis. Works 40 hours a week, primarily stands of walks for her shift. Initially had relief from ibuprofen but not any more.   No current facility-administered medications for this encounter.  Current Outpatient Medications:  .  aspirin 81 MG chewable tablet, Chew 81 mg by mouth daily., Disp: , Rfl:  .  lisinopril (PRINIVIL,ZESTRIL) 20 MG tablet, Take 20 mg by mouth daily., Disp: , Rfl:  .  omeprazole (PRILOSEC) 20 MG capsule, Take 1 capsule (20 mg total) by mouth daily., Disp: 14 capsule, Rfl: 0   Allergies  Allergen Reactions  . Hydrochlorothiazide Nausea Only and Swelling    Past Medical History:  Diagnosis Date  . Depression   . Hypercholesteremia   . Hypertension      History reviewed. No pertinent surgical history.  History reviewed. No pertinent family history.  Social History   Tobacco Use  . Smoking status: Former Smoker    Quit date: 02/29/2012    Years since quitting: 8.3  . Smokeless tobacco: Never Used  Vaping Use  . Vaping Use: Never used  Substance Use Topics  . Alcohol use: No  . Drug use: No    ROS   Objective:   Vitals: BP (!) 148/82 (BP Location: Right Arm)   Pulse 78   Temp 97.9 F (36.6 C)   Resp 16   SpO2 97%   Physical Exam Constitutional:      General: She is not in acute distress.    Appearance: Normal appearance. She is well-developed. She is not ill-appearing.  HENT:     Head: Normocephalic and atraumatic.     Nose: Nose normal.     Mouth/Throat:     Mouth: Mucous membranes are moist.     Pharynx: Oropharynx is clear.  Eyes:     General: No scleral icterus.    Extraocular Movements: Extraocular movements  intact.     Pupils: Pupils are equal, round, and reactive to light.  Cardiovascular:     Rate and Rhythm: Normal rate.  Pulmonary:     Effort: Pulmonary effort is normal.  Musculoskeletal:     Right knee: No swelling, deformity, effusion, erythema, ecchymosis, lacerations, bony tenderness or crepitus. Decreased range of motion. Tenderness present over the medial joint line. No lateral joint line or patellar tendon tenderness. Normal alignment and normal patellar mobility.     Right lower leg: No swelling, deformity, lacerations, tenderness or bony tenderness. No edema.  Skin:    General: Skin is warm and dry.  Neurological:     General: No focal deficit present.     Mental Status: She is alert and oriented to person, place, and time.  Psychiatric:        Mood and Affect: Mood normal.        Behavior: Behavior normal.     DG Knee Complete 4 Views Right  Result Date: 07/02/2020 CLINICAL DATA:  Right knee pain EXAM: RIGHT KNEE - COMPLETE 4+ VIEW COMPARISON:  None. FINDINGS: Joint spaces are maintained. Early spurring in the patellofemoral compartment. No joint effusion. No acute bony abnormality. Specifically, no fracture, subluxation, or dislocation. IMPRESSION: No acute bony abnormality. Electronically Signed   By: Charlett Nose M.D.  On: 07/02/2020 12:05     Assessment and Plan :   PDMP not reviewed this encounter.  1. Acute pain of right knee     Suspect inflammatory process related to her weight, nature of her work. Recommended prednisone course, rest. Wrapped her right knee using 4" Ace wrap. Follow up with ortho to pursue further imaging as needed. Counseled patient on potential for adverse effects with medications prescribed/recommended today, ER and return-to-clinic precautions discussed, patient verbalized understanding.    Wallis Bamberg, PA-C 07/02/20 1216

## 2020-07-02 NOTE — ED Triage Notes (Signed)
Pt said her right knee has been hurting x 1 week. No injury no fall. NO obvious swelling or redness.

## 2021-02-04 ENCOUNTER — Ambulatory Visit (INDEPENDENT_AMBULATORY_CARE_PROVIDER_SITE_OTHER): Payer: BC Managed Care – PPO | Admitting: Orthopaedic Surgery

## 2021-02-04 ENCOUNTER — Other Ambulatory Visit: Payer: Self-pay

## 2021-02-04 ENCOUNTER — Encounter: Payer: Self-pay | Admitting: Orthopaedic Surgery

## 2021-02-04 VITALS — Ht 64.5 in | Wt 224.0 lb

## 2021-02-04 DIAGNOSIS — M25561 Pain in right knee: Secondary | ICD-10-CM | POA: Diagnosis not present

## 2021-02-04 DIAGNOSIS — G8929 Other chronic pain: Secondary | ICD-10-CM

## 2021-02-04 NOTE — Progress Notes (Signed)
Office Visit Note   Patient: Tara Walton           Date of Birth: [birth year]           MRN: [id number] Visit Date: 02/04/2021              Requested by: Ailene Ravel, MD [address] [place],  [place] [address] PCP: Ailene Ravel, MD   Assessment & Plan: Visit Diagnoses:  1. Chronic pain of right knee     Plan: Tara Walton has been experiencing pain in her right knee off and on for many months.  She notes that about 6 months ago she went to an urgent care related to her knee and was eventually seen at [facility].  She received a cortisone injection along the medial compartment and relates that she is not sure it made much of a difference.  She feels as though her knee may be "on fire" and "burns" along the proximal tibia medially.  She does take Aleve which seems to help.  She had films of her knee in March which I reviewed on the PACS system.  I thought there was a little narrowing of the medial compartment and some decreased bone density which could be consistent with an osteochondral lesion of the distal femur.  I think it is worth obtaining an MRI scan given the chronicity of her problem with recurrent pain and minimal relief with present treatment  Follow-Up Instructions: Return After MRI scan right knee.   Orders:  Orders Placed This Encounter  Procedures   MR Knee Right w/o contrast   No orders of the defined types were placed in this encounter.     Procedures: No procedures performed   Clinical Data: No additional findings.   Subjective: Chief Complaint  Patient presents with   Right Knee - Pain  Patient presents today for right knee pain that flared up three weeks ago with no injury. She said that it is burning at the medial aspect of her knee. She cannot touch that area at night. She has been taking Aleve. She states that she received a cortisone injection 6 months ago.   HPI  Review of Systems   Objective: Vital Signs: Ht 5' 4.5" (1.638  m)   Wt 224 lb (101.6 kg)   BMI 37.86 kg/m   Physical Exam Constitutional:      Appearance: She is well-developed.  Pulmonary:     Effort: Pulmonary effort is normal.  Skin:    General: Skin is warm and dry.  Neurological:     Mental Status: She is alert and oriented to person, place, and time.  Psychiatric:        Behavior: Behavior normal.    Ortho Exam right knee was not hot red warm or swollen but there was tenderness along the anterior medial joint line without crepitation.  There is also some mild tenderness along the proximal tibia medially.  Full extension.  No effusion.  Flexed over 100 degrees without instability.  Some very minimal patella crepitation but no pain with compression  Specialty Comments:  No specialty comments available.  Imaging: No results found.   PMFS History: Patient Active Problem List   Diagnosis Date Noted   Pain in right knee 02/04/2021   Past Medical History:  Diagnosis Date   Depression    Hypercholesteremia    Hypertension     No family history on file.  History reviewed. No pertinent surgical history. Social  History   Occupational History   Not on file  Tobacco Use   Smoking status: Former    Types: Cigarettes    Quit date: 02/29/2012    Years since quitting: 8.9   Smokeless tobacco: Never  Vaping Use   Vaping Use: Never used  Substance and Sexual Activity   Alcohol use: No   Drug use: No   Sexual activity: Not on file

## 2021-02-21 ENCOUNTER — Other Ambulatory Visit: Payer: Self-pay

## 2021-02-21 ENCOUNTER — Ambulatory Visit
Admission: RE | Admit: 2021-02-21 | Discharge: 2021-02-21 | Disposition: A | Discharge status: Home / Self Care | Payer: BC Managed Care – PPO | Source: Ambulatory Visit | Attending: Orthopaedic Surgery | Admitting: Orthopaedic Surgery

## 2021-02-21 DIAGNOSIS — G8929 Other chronic pain: Secondary | ICD-10-CM

## 2021-02-21 DIAGNOSIS — M25561 Pain in right knee: Secondary | ICD-10-CM

## 2021-02-21 DIAGNOSIS — M1711 Unilateral primary osteoarthritis, right knee: Secondary | ICD-10-CM | POA: Diagnosis not present

## 2021-02-21 DIAGNOSIS — S83241A Other tear of medial meniscus, current injury, right knee, initial encounter: Secondary | ICD-10-CM | POA: Diagnosis not present

## 2021-02-25 ENCOUNTER — Ambulatory Visit: Payer: BC Managed Care – PPO | Admitting: Orthopaedic Surgery

## 2021-02-25 ENCOUNTER — Other Ambulatory Visit: Payer: Self-pay

## 2021-02-25 ENCOUNTER — Telehealth: Payer: Self-pay

## 2021-02-25 ENCOUNTER — Encounter: Payer: Self-pay | Admitting: Orthopaedic Surgery

## 2021-02-25 VITALS — Ht 64.5 in | Wt 224.0 lb

## 2021-02-25 DIAGNOSIS — M25561 Pain in right knee: Secondary | ICD-10-CM | POA: Diagnosis not present

## 2021-02-25 DIAGNOSIS — G8929 Other chronic pain: Secondary | ICD-10-CM

## 2021-02-25 NOTE — Progress Notes (Signed)
Office Visit Note   Patient: Tara Walton           Date of Birth: 1965/08/23           MRN: [id number] Visit Date: 02/25/2021              Requested by: Ailene Ravel, MD [address] [place],  [place] [address] PCP: Ailene Ravel, MD   Assessment & Plan: Visit Diagnoses:  1. Chronic pain of right knee     Plan: Mrs. Arcand is accompanied by her daughter and here for follow-up evaluation of her chronic right knee pain.  She had an MRI scan of her knee dated 02/21/2021 that demonstrated a probable peripheral radial tear involving the meniscal body medially.  The meniscal root was intact.  No displaced meniscal fragments.  Lateral meniscus was intact.  Cruciates and collateral ligaments were intact.  The patellofemoral cartilage was preserved but there was mild chondral thinning without focal defect medially.  Laterally the cartilage was preserved.  No joint effusion.  No Baker's cyst.  No acute or significant extra-articular osseous findings.  I thought she might have an osteochondral lesion based on her x-rays but that was not demonstrated by MRI scan.  Mrs. Kintzel had a cortisone injection in her knee previously and relates that it lasted about 5 months.  She is having trouble when she stands for long period of time with her knee "burning".  Her knee is not giving way and she is not having any catching.  I believe her problem is related to the arthritis and not the meniscal tear.  She is not having any posterior medial joint pain but mostly anterior.  Long discussion regarding treatment options.  She will take 2 Aleve in the morning and feels fine most of the day.  She feels fine when she is off her feet on weekends.  Might be worthwhile to consider viscosupplementation.  We will pre-CERT  Follow-Up Instructions: Return Pre-CERT viscosupplementation.   Orders:  No orders of the defined types were placed in this encounter.  No orders of the defined types were placed in this  encounter.     Procedures: No procedures performed   Clinical Data: No additional findings.   Subjective: Chief Complaint  Patient presents with   Right Knee - Pain, Follow-up  Here for results of the MRI scan right knee.  She is accompanied by her daughter.  No change in symptoms.  Most of her pain occurs on the days that she is on her feet for 10 to 12 hours a day at work.  She is better when she is not working on the weekends.  She has had a previous cortisone injection that lasted a least 5 months  HPI  Review of Systems   Objective: Vital Signs: Ht 5' 4.5" (1.638 m)   Wt 224 lb (101.6 kg)   BMI 37.86 kg/m   Physical Exam Constitutional:      Appearance: She is well-developed.  Pulmonary:     Effort: Pulmonary effort is normal.  Skin:    General: Skin is warm and dry.  Neurological:     Mental Status: She is alert and oriented to person, place, and time.  Psychiatric:        Behavior: Behavior normal.    Ortho Exam awake alert and oriented x3.  Comfortable sitting.  Right knee with no effusion but there was some medial joint pain mostly anteriorly.  No popping or catching.  No instability.  No popliteal pain or mass.  No distal edema.  Neurologically intact.  Painless range of motion both hips.  Straight leg raise negative  Specialty Comments:  No specialty comments available.  Imaging: No results found.   PMFS History: Patient Active Problem List   Diagnosis Date Noted   Pain in right knee 02/04/2021   Past Medical History:  Diagnosis Date   Depression    Hypercholesteremia    Hypertension     History reviewed. No pertinent family history.  History reviewed. No pertinent surgical history. Social History   Occupational History   Not on file  Tobacco Use   Smoking status: Former    Types: Cigarettes    Quit date: 02/29/2012    Years since quitting: 8.9   Smokeless tobacco: Never  Vaping Use   Vaping Use: Never used  Substance and Sexual  Activity   Alcohol use: No   Drug use: No   Sexual activity: Not on file     Valeria Batman, MD   Note - This record has been created using [employer].  Chart creation errors have been sought, but may not always  have been located. Such creation errors do not reflect on  the standard of medical care.

## 2021-02-25 NOTE — Telephone Encounter (Signed)
Please get auth for right knee gel injection-Dr. Whitfield pt.  

## 2021-02-26 NOTE — Telephone Encounter (Signed)
Noted  

## 2021-03-07 ENCOUNTER — Telehealth: Payer: Self-pay

## 2021-03-07 NOTE — Telephone Encounter (Signed)
VOB submitted for Synvisc, right knee ?BV pending ?

## 2021-03-10 ENCOUNTER — Telehealth: Payer: Self-pay

## 2021-03-10 NOTE — Telephone Encounter (Signed)
PA required for Synvisc series PA submitted online through Cover[employer]ymeds Pending PA# [id number]

## 2021-03-11 ENCOUNTER — Telehealth: Payer: Self-pay

## 2021-03-11 NOTE — Telephone Encounter (Signed)
Approved for Synvisc, right knee. Buy & Bill Covered at 100% after Co-pay Co-pay of $50.00 per date of visit PA Approval# [id number] Valid 03/10/2021- 09/05/2021  Appts. 11/9,11/16,03/26/2021 with Dr. Cleophas Dunker

## 2021-03-12 ENCOUNTER — Ambulatory Visit: Payer: BC Managed Care – PPO | Admitting: Orthopaedic Surgery

## 2021-03-19 ENCOUNTER — Ambulatory Visit: Payer: BC Managed Care – PPO | Admitting: Orthopaedic Surgery

## 2021-03-26 ENCOUNTER — Ambulatory Visit: Payer: BC Managed Care – PPO | Admitting: Orthopaedic Surgery

## 2021-04-11 DIAGNOSIS — F419 Anxiety disorder, unspecified: Secondary | ICD-10-CM | POA: Diagnosis not present

## 2021-04-11 DIAGNOSIS — I10 Essential (primary) hypertension: Secondary | ICD-10-CM | POA: Diagnosis not present

## 2021-04-11 DIAGNOSIS — M1990 Unspecified osteoarthritis, unspecified site: Secondary | ICD-10-CM | POA: Diagnosis not present

## 2021-05-13 DIAGNOSIS — R599 Enlarged lymph nodes, unspecified: Secondary | ICD-10-CM | POA: Diagnosis not present

## 2021-05-13 DIAGNOSIS — J3489 Other specified disorders of nose and nasal sinuses: Secondary | ICD-10-CM | POA: Diagnosis not present

## 2021-05-13 DIAGNOSIS — R52 Pain, unspecified: Secondary | ICD-10-CM | POA: Diagnosis not present

## 2021-05-23 ENCOUNTER — Telehealth: Payer: Self-pay | Admitting: Orthopaedic Surgery

## 2021-05-23 NOTE — Telephone Encounter (Signed)
Patient called advised she has fluid on her right knee. Patient wanted to know if she need to have the fluid drained before she reschedule the 3 injections? The number to contact patient is [phone]

## 2021-05-26 NOTE — Telephone Encounter (Signed)
Called and spoke with patient. We can aspirate at same time if needed. Scheduled patient for her upcoming three appointments.

## 2021-06-10 ENCOUNTER — Other Ambulatory Visit: Payer: Self-pay

## 2021-06-10 ENCOUNTER — Encounter: Payer: Self-pay | Admitting: Physician Assistant

## 2021-06-10 ENCOUNTER — Ambulatory Visit: Payer: BC Managed Care – PPO | Admitting: Orthopaedic Surgery

## 2021-06-10 ENCOUNTER — Ambulatory Visit: Payer: BC Managed Care – PPO | Admitting: Physician Assistant

## 2021-06-10 DIAGNOSIS — M1712 Unilateral primary osteoarthritis, left knee: Secondary | ICD-10-CM

## 2021-06-10 MED ORDER — LIDOCAINE HCL 1 % IJ SOLN
3.0000 mL | INTRAMUSCULAR | Status: AC | PRN
  Administered 2021-06-10: 3 mL

## 2021-06-10 MED ORDER — HYLAN G-F 20 16 MG/2ML IX SOSY
16.0000 mg | PREFILLED_SYRINGE | INTRA_ARTICULAR | Status: AC | PRN
  Administered 2021-06-10: 16 mg via INTRA_ARTICULAR

## 2021-06-10 NOTE — Progress Notes (Signed)
Office Visit Note   Patient: Tara Walton           Date of Birth: 11-28-1965           MRN: [id number] Visit Date: 06/10/2021              Requested by: Leonides Sake, MD [place],  [place] [address] PCP: Leonides Sake, MD  Chief Complaint  Patient presents with   Right Knee - Pain, Follow-up      HPI: Patient is a pleasant 56 year old woman who comes in for her Synvisc injection into her right knee first 1.  She is also requesting fluid be drawn off her knee.  Assessment & Plan: Visit Diagnoses: No diagnosis found.  Plan: Patient will return for Synvisc in 1 week.  35 cc of clear serous light yellow fluid was aspirated without difficulty  Follow-Up Instructions: No follow-ups on file.   Ortho Exam  Patient is alert, oriented, no adenopathy, well-dressed, normal affect, normal respiratory effort. Right knee she does have an effusion more laterally than medially.  No redness no erythema  Imaging: No results found. No images are attached to the encounter.  Labs: No results found for: HGBA1C, ESRSEDRATE, CRP, LABURIC, REPTSTATUS, GRAMSTAIN, CULT, LABORGA   Lab Results  Component Value Date   ALBUMIN 4.1 07/04/2016   ALBUMIN 3.7 02/05/2016   ALBUMIN 4.0 03/01/2015    No results found for: MG No results found for: VD25OH  No results found for: PREALBUMIN CBC EXTENDED Latest Ref Rng & Units 07/04/2016 02/05/2016 03/01/2015  WBC 4.0 - 10.5 K/uL 8.8 12.2(H) 9.3  RBC 3.87 - 5.11 MIL/uL 4.83 4.83 4.76  HGB 12.0 - 15.0 g/dL 13.7 13.6 13.2  HCT 36.0 - 46.0 % 41.7 41.2 40.7  PLT 150 - 400 K/uL 259 305 278  NEUTROABS 1.7 - 7.7 K/uL - 8.4(H) 5.9  LYMPHSABS 0.7 - 4.0 K/uL - 3.0 2.8     There is no height or weight on file to calculate BMI.  Orders:  No orders of the defined types were placed in this encounter.  No orders of the defined types were placed in this encounter.    Procedures: Large Joint Inj: R knee on 06/10/2021 8:47  AM Indications: pain and diagnostic evaluation Details: 18 G 1.5 in needle, superolateral approach  Arthrogram: No  Medications: 3 mL lidocaine 1 %; 16 mg Hylan 16 MG/2ML Aspirate: 35 mL serous and yellow Outcome: tolerated well, no immediate complications Procedure, treatment alternatives, risks and benefits explained, specific risks discussed. Consent was given by the patient.     Clinical Data: No additional findings.  ROS:  All other systems negative, except as noted in the HPI. Review of Systems  Objective: Vital Signs: There were no vitals taken for this visit.  Specialty Comments:  No specialty comments available.  PMFS History: Patient Active Problem List   Diagnosis Date Noted   Pain in right knee 02/04/2021   Past Medical History:  Diagnosis Date   Depression    Hypercholesteremia    Hypertension     History reviewed. No pertinent family history.  History reviewed. No pertinent surgical history. Social History   Occupational History   Not on file  Tobacco Use   Smoking status: Former    Types: Cigarettes    Quit date: 02/29/2012    Years since quitting: 9.2   Smokeless tobacco: Never  Vaping Use   Vaping Use: Never used  Substance and Sexual  Activity   Alcohol use: No   Drug use: No   Sexual activity: Not on file

## 2021-06-17 ENCOUNTER — Ambulatory Visit: Payer: BC Managed Care – PPO | Admitting: Physician Assistant

## 2021-06-17 ENCOUNTER — Other Ambulatory Visit: Payer: Self-pay

## 2021-06-17 ENCOUNTER — Encounter: Payer: Self-pay | Admitting: Physician Assistant

## 2021-06-17 VITALS — Ht 65.0 in | Wt 230.0 lb

## 2021-06-17 DIAGNOSIS — M1712 Unilateral primary osteoarthritis, left knee: Secondary | ICD-10-CM | POA: Diagnosis not present

## 2021-06-17 MED ORDER — METHYLPREDNISOLONE ACETATE 40 MG/ML IJ SUSP
40.0000 mg | INTRAMUSCULAR | Status: AC | PRN
  Administered 2021-06-17: 40 mg via INTRA_ARTICULAR

## 2021-06-17 MED ORDER — HYLAN G-F 20 16 MG/2ML IX SOSY
16.0000 mg | PREFILLED_SYRINGE | INTRA_ARTICULAR | Status: AC | PRN
  Administered 2021-06-17: 16 mg via INTRA_ARTICULAR

## 2021-06-17 NOTE — Progress Notes (Signed)
Office Visit Note   Patient: Tara Walton           Date of Birth: 06/08/1965           MRN: [id number] Visit Date: 06/17/2021              Requested by: Leonides Sake, MD [place],  [place] [address] PCP: Leonides Sake, MD  Chief Complaint  Patient presents with   Right Knee - Pain, Follow-up    Synvisc #2 injection right knee      HPI: Patient is a pleasant 56 year old woman who comes in for her second Synvisc injection.  She tolerated the first 1 quite well.  At that time we did aspirate 30 cc of fluid.  She said the fluid returned as soon as she got home.  She other denies any redness fever chills  Assessment & Plan: Visit Diagnoses: No diagnosis found.  Plan: We will follow-up for final Synvisc in 1 week  Follow-Up Instructions: No follow-ups on file.   Ortho Exam  Patient is alert, oriented, no adenopathy, well-dressed, normal affect, normal respiratory effort. Examination of her right knee she does have a small effusion that is localized laterally.  She has good range of motion she says the pain is much better with range of motion.  Imaging: No results found. No images are attached to the encounter.  Labs: No results found for: HGBA1C, ESRSEDRATE, CRP, LABURIC, REPTSTATUS, GRAMSTAIN, CULT, LABORGA   Lab Results  Component Value Date   ALBUMIN 4.1 07/04/2016   ALBUMIN 3.7 02/05/2016   ALBUMIN 4.0 03/01/2015    No results found for: MG No results found for: VD25OH  No results found for: PREALBUMIN CBC EXTENDED Latest Ref Rng & Units 07/04/2016 02/05/2016 03/01/2015  WBC 4.0 - 10.5 K/uL 8.8 12.2(H) 9.3  RBC 3.87 - 5.11 MIL/uL 4.83 4.83 4.76  HGB 12.0 - 15.0 g/dL 13.7 13.6 13.2  HCT 36.0 - 46.0 % 41.7 41.2 40.7  PLT 150 - 400 K/uL 259 305 278  NEUTROABS 1.7 - 7.7 K/uL - 8.4(H) 5.9  LYMPHSABS 0.7 - 4.0 K/uL - 3.0 2.8     Body mass index is 38.27 kg/m.  Orders:  No orders of the defined types were placed in this encounter.  No  orders of the defined types were placed in this encounter.    Procedures: Large Joint Inj on 06/17/2021 8:33 AM Indications: pain and diagnostic evaluation Details: 25 G 1.5 in needle  Arthrogram: No  Medications: 40 mg methylPREDNISolone acetate 40 MG/ML; 16 mg Hylan 16 MG/2ML Outcome: tolerated well, no immediate complications Procedure, treatment alternatives, risks and benefits explained, specific risks discussed. Consent was given by the patient. Immediately prior to procedure a time out was called to verify the correct patient, procedure, equipment, support staff and site/side marked as required. Patient was prepped and draped in the usual sterile fashion.     Clinical Data: No additional findings.  ROS:  All other systems negative, except as noted in the HPI. Review of Systems  Objective: Vital Signs: Ht 5\' 5"  (1.651 m)    Wt 230 lb (104.3 kg)    BMI 38.27 kg/m   Specialty Comments:  No specialty comments available.  PMFS History: Patient Active Problem List   Diagnosis Date Noted   Pain in right knee 02/04/2021   Past Medical History:  Diagnosis Date   Depression    Hypercholesteremia    Hypertension     No  family history on file.  No past surgical history on file. Social History   Occupational History   Not on file  Tobacco Use   Smoking status: Former    Types: Cigarettes    Quit date: 02/29/2012    Years since quitting: 9.3   Smokeless tobacco: Never  Vaping Use   Vaping Use: Never used  Substance and Sexual Activity   Alcohol use: No   Drug use: No   Sexual activity: Not on file

## 2021-06-24 ENCOUNTER — Other Ambulatory Visit: Payer: Self-pay

## 2021-06-24 ENCOUNTER — Ambulatory Visit: Payer: BC Managed Care – PPO | Admitting: Physician Assistant

## 2021-06-24 ENCOUNTER — Encounter: Payer: Self-pay | Admitting: Physician Assistant

## 2021-06-24 DIAGNOSIS — G8929 Other chronic pain: Secondary | ICD-10-CM

## 2021-06-24 DIAGNOSIS — M25561 Pain in right knee: Secondary | ICD-10-CM

## 2021-06-24 MED ORDER — LIDOCAINE HCL 1 % IJ SOLN
5.0000 mL | INTRAMUSCULAR | Status: AC | PRN
  Administered 2021-06-24: 5 mL

## 2021-06-24 MED ORDER — HYLAN G-F 20 16 MG/2ML IX SOSY
16.0000 mg | PREFILLED_SYRINGE | INTRA_ARTICULAR | Status: AC | PRN
  Administered 2021-06-24: 16 mg via INTRA_ARTICULAR

## 2021-06-24 NOTE — Progress Notes (Signed)
Office Visit Note   Patient: Tara Walton           Date of Birth: Feb 18, 1966           MRN: [id number] Visit Date: 06/24/2021              Requested by: Leonides Sake, MD [place],  [place] [address] PCP: Leonides Sake, MD  Chief Complaint  Patient presents with   Right Knee - Pain, Follow-up      HPI: Patient is a pleasant 56 year old woman who comes in for her third Synvisc injection into her right knee.  She does say that the injection last week was extremely painful immediately after the injection.  She did feel better the next day and thinks the injections are helping.  She is wondering if she could have some local injected with in her knee  Assessment & Plan: Visit Diagnoses: No diagnosis found.  Plan: Patient reported pain being less today.  I did inject on the anterior lateral side.  She would like to follow-up in a month with Dr. Durward Fortes I think that is fine.  Follow-Up Instructions: No follow-ups on file.   Ortho Exam  Patient is alert, oriented, no adenopathy, well-dressed, normal affect, normal respiratory effort. Examination of her right knee she does have some effusion.  No erythema no redness no tenderness  Imaging: No results found. No images are attached to the encounter.  Labs: No results found for: HGBA1C, ESRSEDRATE, CRP, LABURIC, REPTSTATUS, GRAMSTAIN, CULT, LABORGA   Lab Results  Component Value Date   ALBUMIN 4.1 07/04/2016   ALBUMIN 3.7 02/05/2016   ALBUMIN 4.0 03/01/2015    No results found for: MG No results found for: VD25OH  No results found for: PREALBUMIN CBC EXTENDED Latest Ref Rng & Units 07/04/2016 02/05/2016 03/01/2015  WBC 4.0 - 10.5 K/uL 8.8 12.2(H) 9.3  RBC 3.87 - 5.11 MIL/uL 4.83 4.83 4.76  HGB 12.0 - 15.0 g/dL 13.7 13.6 13.2  HCT 36.0 - 46.0 % 41.7 41.2 40.7  PLT 150 - 400 K/uL 259 305 278  NEUTROABS 1.7 - 7.7 K/uL - 8.4(H) 5.9  LYMPHSABS 0.7 - 4.0 K/uL - 3.0 2.8     There is no height or  weight on file to calculate BMI.  Orders:  No orders of the defined types were placed in this encounter.  No orders of the defined types were placed in this encounter.    Procedures: Large Joint Inj on 06/24/2021 8:48 AM Indications: pain and diagnostic evaluation Details: 25 G 1.5 in needle, anterolateral approach  Arthrogram: No  Medications: 5 mL lidocaine 1 %; 16 mg Hylan 16 MG/2ML Outcome: tolerated well, no immediate complications Procedure, treatment alternatives, risks and benefits explained, specific risks discussed. Consent was given by the patient.     Clinical Data: No additional findings.  ROS:  All other systems negative, except as noted in the HPI. Review of Systems  Objective: Vital Signs: There were no vitals taken for this visit.  Specialty Comments:  No specialty comments available.  PMFS History: Patient Active Problem List   Diagnosis Date Noted   Pain in right knee 02/04/2021   Past Medical History:  Diagnosis Date   Depression    Hypercholesteremia    Hypertension     History reviewed. No pertinent family history.  History reviewed. No pertinent surgical history. Social History   Occupational History   Not on file  Tobacco Use   Smoking status:  Former    Types: Cigarettes    Quit date: 02/29/2012    Years since quitting: 9.3   Smokeless tobacco: Never  Vaping Use   Vaping Use: Never used  Substance and Sexual Activity   Alcohol use: No   Drug use: No   Sexual activity: Not on file

## 2021-07-07 DIAGNOSIS — F419 Anxiety disorder, unspecified: Secondary | ICD-10-CM | POA: Diagnosis not present

## 2021-07-07 DIAGNOSIS — I10 Essential (primary) hypertension: Secondary | ICD-10-CM | POA: Diagnosis not present

## 2021-07-14 DIAGNOSIS — I10 Essential (primary) hypertension: Secondary | ICD-10-CM | POA: Diagnosis not present

## 2021-07-31 ENCOUNTER — Other Ambulatory Visit: Payer: Self-pay

## 2021-07-31 DIAGNOSIS — R2233 Localized swelling, mass and lump, upper limb, bilateral: Secondary | ICD-10-CM

## 2021-08-07 ENCOUNTER — Ambulatory Visit
Admission: RE | Admit: 2021-08-07 | Discharge: 2021-08-07 | Disposition: A | Discharge status: Home / Self Care | Payer: No Typology Code available for payment source | Source: Ambulatory Visit | Attending: Obstetrics and Gynecology | Admitting: Obstetrics and Gynecology

## 2021-08-07 ENCOUNTER — Ambulatory Visit
Admission: RE | Admit: 2021-08-07 | Discharge: 2021-08-07 | Disposition: A | Discharge status: Home / Self Care | Payer: BC Managed Care – PPO | Source: Ambulatory Visit | Attending: Obstetrics and Gynecology | Admitting: Obstetrics and Gynecology

## 2021-08-07 ENCOUNTER — Ambulatory Visit
Admission: RE | Admit: 2021-08-07 | Discharge: 2021-08-07 | Disposition: A | Discharge status: Home / Self Care | Payer: Self-pay | Source: Ambulatory Visit | Attending: Obstetrics and Gynecology | Admitting: Obstetrics and Gynecology

## 2021-08-07 ENCOUNTER — Ambulatory Visit: Payer: BC Managed Care – PPO | Admitting: *Deleted

## 2021-08-07 VITALS — BP 150/90 | Wt 229.2 lb

## 2021-08-07 DIAGNOSIS — R2233 Localized swelling, mass and lump, upper limb, bilateral: Secondary | ICD-10-CM

## 2021-08-07 DIAGNOSIS — Z1211 Encounter for screening for malignant neoplasm of colon: Secondary | ICD-10-CM

## 2021-08-07 DIAGNOSIS — Z01419 Encounter for gynecological examination (general) (routine) without abnormal findings: Secondary | ICD-10-CM

## 2021-08-07 DIAGNOSIS — R2231 Localized swelling, mass and lump, right upper limb: Secondary | ICD-10-CM

## 2021-08-07 DIAGNOSIS — R2232 Localized swelling, mass and lump, left upper limb: Secondary | ICD-10-CM

## 2021-08-07 NOTE — Patient Instructions (Signed)
Explained breast self awareness with Darrall Dears. Pap smear completed today. Let patient know that her next Pap smear will be due based on the result of today's Pap smear. Referred patient to the [facility] for a diagnostic mammogram. Appointment scheduled Thursday, August 07, 2021 at 1520. Patient aware of appointment and will be there. Let patient know will follow up with her within the next couple weeks with results of Pap smear by phone. Darrall Dears verbalized understanding. ? ?Reyden Smith, Kathaleen Maser, RN ?2:19 PM ? ? ? ? ?

## 2021-08-07 NOTE — Progress Notes (Signed)
Ms. MELLINA BENISON is a 56 y.o. No obstetric history on file. female who presents to [facility] clinic today with complaint of bilateral axillary lumps x 5 years that have increased in size over the years.  ?  ?Pap Smear: Pap smear completed today. Last Pap smear was 03/10/2019 at [facility] clinic and was normal with negative HPV. Per patient has history of an abnormal Pap smear 10/26/2016 that was AGUS that a colposcopy was completed for follow up that was benign. Last Pap smear result is available in Epic. ?  ?Physical exam: ?Breasts ?Left breast slightly larger than right breast that is normal per patient. No skin abnormalities bilateral breasts. No nipple retraction bilateral breasts. No nipple discharge bilateral breasts. No lymphadenopathy. Palpated a lump within the left upper outer quadrant of breast next the axilla 12 cm from the nipple. Palpated a lump within the right upper outer quadrant of breast next the axilla 14 cm from the nipple. Complaints of tenderness when palpated right breast lump.      ? ?Pelvic/Bimanual ?Ext Genitalia ?No lesions, no swelling and no discharge observed on external genitalia.      ?  ?Vagina ?Vagina pink and normal texture. No lesions or discharge observed in vagina.      ?  ?Cervix ?Cervix is present. Cervix pink and of normal texture. No discharge observed.  ?  ?Uterus ?Uterus is present and palpable. Uterus is retroverted and normal size.      ?  ?Adnexae ?Bilateral ovaries present and palpable. No tenderness on palpation.       ?  ?Rectovaginal ?No rectal exam completed today since patient had no rectal complaints. No skin abnormalities observed on exam.   ?  ?Smoking History: ?Patient is a former smoker that quit in 2013. ?  ?Patient Navigation: ?Patient education provided. Access to services provided for patient through [facility] program.  ? ?Colorectal Cancer Screening: ?Per patient has never had colonoscopy completed. FIT Test given to patient to complete.  No complaints today.  ?  ?Breast and Cervical Cancer Risk Assessment: ?Patient does not have family history of breast cancer, known genetic mutations, or radiation treatment to the chest before age 53. Patient has history of cervical dysplasia. Patient has no history of being immunocompromised or DES exposure in-utero. ? ?Risk Assessment   ? ? Risk Scores   ? ?   08/07/2021  ? Last edited by: Meryl Dare, CMA  ? 5-year risk: 1 %  ? Lifetime risk: 6.7 %  ? ?  ?  ? ?  ? ? ?A: ?[facility] exam with pap smear ?Complaint of bilateral axillary lumps. ? ?P: ?Referred patient to the [facility] for a diagnostic mammogram. Appointment scheduled Thursday, August 07, 2021 at 1520. ? ?Priscille Heidelberg, RN ?08/07/2021 2:19 PM   ?

## 2021-08-11 LAB — CYTOLOGY - PAP
Comment: NEGATIVE
Diagnosis: NEGATIVE
High risk HPV: NEGATIVE

## 2021-08-12 ENCOUNTER — Telehealth: Payer: Self-pay

## 2021-08-12 NOTE — Telephone Encounter (Signed)
Patient informed negative Pap/HPV results. Patient verbalized understanding.  

## 2021-08-12 NOTE — Telephone Encounter (Signed)
Called patient to give pap smear results. Informed patient that pap smear was normal and HPV was negative. Next pap will be due in 1 year based on previous hx. Patient voiced understanding. ?

## 2022-01-16 ENCOUNTER — Emergency Department (HOSPITAL_COMMUNITY)
Admission: EM | Admit: 2022-01-16 | Discharge: 2022-01-16 | Disposition: A | Discharge status: Home / Self Care | Payer: No Typology Code available for payment source | Attending: Emergency Medicine | Admitting: Emergency Medicine

## 2022-01-16 ENCOUNTER — Encounter (HOSPITAL_COMMUNITY): Payer: Self-pay

## 2022-01-16 ENCOUNTER — Other Ambulatory Visit: Payer: Self-pay

## 2022-01-16 DIAGNOSIS — M5431 Sciatica, right side: Secondary | ICD-10-CM

## 2022-01-16 DIAGNOSIS — Z79899 Other long term (current) drug therapy: Secondary | ICD-10-CM | POA: Diagnosis not present

## 2022-01-16 DIAGNOSIS — M5441 Lumbago with sciatica, right side: Secondary | ICD-10-CM | POA: Diagnosis not present

## 2022-01-16 DIAGNOSIS — Z7982 Long term (current) use of aspirin: Secondary | ICD-10-CM | POA: Diagnosis not present

## 2022-01-16 DIAGNOSIS — M545 Low back pain, unspecified: Secondary | ICD-10-CM | POA: Diagnosis present

## 2022-01-16 MED ORDER — PREDNISONE 50 MG PO TABS: 50.0000 mg | ORAL_TABLET | Freq: Every day | ORAL | 0 refills | Status: DC

## 2022-01-16 MED ORDER — METHOCARBAMOL 500 MG PO TABS: 500.0000 mg | ORAL_TABLET | Freq: Two times a day (BID) | ORAL | 0 refills | Status: DC

## 2022-01-16 MED ORDER — IBUPROFEN 600 MG PO TABS: 600.0000 mg | ORAL_TABLET | Freq: Four times a day (QID) | ORAL | 0 refills | Status: DC | PRN

## 2022-01-16 MED ORDER — DEXAMETHASONE SODIUM PHOSPHATE 10 MG/ML IJ SOLN
10.0000 mg | Freq: Once | INTRAMUSCULAR | Status: AC
  Administered 2022-01-16: 10 mg via INTRAMUSCULAR
  Filled 2022-01-16: qty 1

## 2022-01-16 MED ORDER — KETOROLAC TROMETHAMINE 30 MG/ML IJ SOLN
30.0000 mg | Freq: Once | INTRAMUSCULAR | Status: AC
  Administered 2022-01-16: 30 mg via INTRAMUSCULAR
  Filled 2022-01-16: qty 1

## 2022-01-16 NOTE — ED Provider Notes (Signed)
[facility]-EMERGENCY DEPT Provider Note   CSN: [id number] Arrival date & time: 01/16/22  1028     History  Chief Complaint  Patient presents with   Back Pain    Tara Walton is a 56 y.o. female.  Pt is a 56 yo female with a pmhx significant for htn.  Pt was at work today and reached up to move a box.  The box was on the top shelf and it was heavy.  She was expecting something light and when she moved the heavy box, she felt a pop and a pain in her back.  She has a little numbness in her anterior thigh, but no pain or weakness in her legs.  She is able to ambulate.  Pt denies any other injury.       Home Medications Prior to Admission medications   Medication Sig Start Date End Date Taking? Authorizing Provider  ibuprofen (ADVIL) 600 MG tablet Take 1 tablet (600 mg total) by mouth every 6 (six) hours as needed. 01/16/22  Yes Jacalyn Lefevre, MD  methocarbamol (ROBAXIN) 500 MG tablet Take 1 tablet (500 mg total) by mouth 2 (two) times daily. 01/16/22  Yes Jacalyn Lefevre, MD  predniSONE (DELTASONE) 50 MG tablet Take 1 tablet (50 mg total) by mouth daily with breakfast. 01/16/22  Yes Jacalyn Lefevre, MD  aspirin 81 MG chewable tablet Chew 81 mg by mouth daily. Patient not taking: Reported on 08/07/2021    [provider]  lisinopril (PRINIVIL,ZESTRIL) 20 MG tablet Take 20 mg by mouth daily.    [provider]  omeprazole (PRILOSEC) 20 MG capsule Take 1 capsule (20 mg total) by mouth daily. Patient not taking: Reported on 08/07/2021 07/04/16   Benjiman Core, MD      Allergies    Hydrochlorothiazide    Review of Systems   Review of Systems  Musculoskeletal:  Positive for back pain.  All other systems reviewed and are negative.   Physical Exam Updated Vital Signs BP (!) 151/72 (BP Location: Right Arm)   Pulse 69   Temp 98.3 F (36.8 C) (Oral)   Resp 18   Ht 5\' 5"  (1.651 m)   Wt 100.7 kg   LMP 02/03/2015 (Approximate)   SpO2 99%    BMI 36.94 kg/m  Physical Exam Vitals and nursing note reviewed.  Constitutional:      Appearance: Normal appearance.  HENT:     Head: Normocephalic and atraumatic.     Right Ear: External ear normal.     Left Ear: External ear normal.     Nose: Nose normal.     Mouth/Throat:     Mouth: Mucous membranes are moist.     Pharynx: Oropharynx is clear.  Eyes:     Extraocular Movements: Extraocular movements intact.     Conjunctiva/sclera: Conjunctivae normal.     Pupils: Pupils are equal, round, and reactive to light.  Cardiovascular:     Rate and Rhythm: Normal rate and regular rhythm.     Pulses: Normal pulses.     Heart sounds: Normal heart sounds.  Pulmonary:     Effort: Pulmonary effort is normal.     Breath sounds: Normal breath sounds.  Abdominal:     General: Abdomen is flat. Bowel sounds are normal.     Palpations: Abdomen is soft.  Musculoskeletal:       Arms:     Cervical back: Normal range of motion and neck supple.  Skin:    General:  Skin is warm.     Capillary Refill: Capillary refill takes less than 2 seconds.  Neurological:     General: No focal deficit present.     Mental Status: She is alert and oriented to person, place, and time.  Psychiatric:        Mood and Affect: Mood normal.        Behavior: Behavior normal.     ED Results / Procedures / Treatments   Labs (all labs ordered are listed, but only abnormal results are displayed) Labs Reviewed - No data to display  EKG None  Radiology No results found.  Procedures Procedures    Medications Ordered in ED Medications  ketorolac (TORADOL) 30 MG/ML injection 30 mg (has no administration in time range)  dexamethasone (DECADRON) injection 10 mg (has no administration in time range)    ED Course/ Medical Decision Making/ A&P                           Medical Decision Making Risk Prescription drug management.   This patient presents to the ED for concern of low back pain, this involves an  extensive number of treatment options, and is a complaint that carries with it a high risk of complications and morbidity.  The differential diagnosis includes msk, sciatica   Co morbidities that complicate the patient evaluation  htn   Additional history obtained:  Additional history obtained from epic chart review   Medicines ordered and prescription drug management:  I ordered medication including toradol and decadron  for pain  Reevaluation of the patient after these medicines showed that the patient improved I have reviewed the patients home medicines and have made adjustments as needed   Problem List / ED Course:  LBP with associated anterior thigh numbness:  likely pinched nerve.  No need for emergent MRI.  Pt d/c with prednisone, ibuprofen, and robaxin.  She is to f/u with pcp and with ortho.  Return if worse.  She is also provided with back exercises.    Social Determinants of Health:  Lives at home   Dispostion:  After consideration of the diagnostic results and the patients response to treatment, I feel that the patent would benefit from discharge with outpatient f/u.          Final Clinical Impression(s) / ED Diagnoses Final diagnoses:  Sciatica of right side    Rx / DC Orders ED Discharge Orders          Ordered    ibuprofen (ADVIL) 600 MG tablet  Every 6 hours PRN        01/16/22 1052    methocarbamol (ROBAXIN) 500 MG tablet  2 times daily        01/16/22 1052    predniSONE (DELTASONE) 50 MG tablet  Daily with breakfast        01/16/22 1052              Jacalyn Lefevre, MD 01/16/22 1058

## 2022-01-16 NOTE — ED Triage Notes (Signed)
Patient states she was moving a box off of a cart today at work and hurt her right lower back. Patient states she is having pain and numbness to the right leg.

## 2022-01-22 DIAGNOSIS — I10 Essential (primary) hypertension: Secondary | ICD-10-CM | POA: Diagnosis not present

## 2022-02-19 DIAGNOSIS — I10 Essential (primary) hypertension: Secondary | ICD-10-CM | POA: Diagnosis not present

## 2022-02-19 DIAGNOSIS — Z0000 Encounter for general adult medical examination without abnormal findings: Secondary | ICD-10-CM | POA: Diagnosis not present

## 2022-02-19 DIAGNOSIS — Z1331 Encounter for screening for depression: Secondary | ICD-10-CM | POA: Diagnosis not present

## 2022-02-19 DIAGNOSIS — Z13228 Encounter for screening for other metabolic disorders: Secondary | ICD-10-CM | POA: Diagnosis not present

## 2022-02-19 DIAGNOSIS — Z1389 Encounter for screening for other disorder: Secondary | ICD-10-CM | POA: Diagnosis not present

## 2022-05-19 DIAGNOSIS — H65191 Other acute nonsuppurative otitis media, right ear: Secondary | ICD-10-CM | POA: Diagnosis not present

## 2022-05-19 DIAGNOSIS — J019 Acute sinusitis, unspecified: Secondary | ICD-10-CM | POA: Diagnosis not present

## 2022-05-19 DIAGNOSIS — Z20822 Contact with and (suspected) exposure to covid-19: Secondary | ICD-10-CM | POA: Diagnosis not present

## 2022-05-19 DIAGNOSIS — E119 Type 2 diabetes mellitus without complications: Secondary | ICD-10-CM | POA: Diagnosis not present

## 2022-07-31 DIAGNOSIS — E785 Hyperlipidemia, unspecified: Secondary | ICD-10-CM | POA: Diagnosis not present

## 2022-07-31 DIAGNOSIS — E119 Type 2 diabetes mellitus without complications: Secondary | ICD-10-CM | POA: Diagnosis not present

## 2022-07-31 DIAGNOSIS — I10 Essential (primary) hypertension: Secondary | ICD-10-CM | POA: Diagnosis not present

## 2022-10-15 DIAGNOSIS — R079 Chest pain, unspecified: Secondary | ICD-10-CM | POA: Diagnosis not present

## 2022-10-15 DIAGNOSIS — M25512 Pain in left shoulder: Secondary | ICD-10-CM | POA: Diagnosis not present

## 2022-10-15 DIAGNOSIS — R0781 Pleurodynia: Secondary | ICD-10-CM | POA: Diagnosis not present

## 2022-12-17 ENCOUNTER — Other Ambulatory Visit (HOSPITAL_COMMUNITY)
Admission: RE | Admit: 2022-12-17 | Discharge: 2022-12-17 | Disposition: A | Discharge status: Home / Self Care | Payer: BC Managed Care – PPO | Source: Ambulatory Visit | Attending: Family Medicine | Admitting: Family Medicine

## 2022-12-17 ENCOUNTER — Encounter: Payer: Self-pay | Admitting: Family Medicine

## 2022-12-17 ENCOUNTER — Ambulatory Visit: Payer: BC Managed Care – PPO | Admitting: Family Medicine

## 2022-12-17 VITALS — BP 136/63 | HR 72 | Ht 65.0 in | Wt 222.0 lb

## 2022-12-17 DIAGNOSIS — I10 Essential (primary) hypertension: Secondary | ICD-10-CM | POA: Diagnosis not present

## 2022-12-17 DIAGNOSIS — E782 Mixed hyperlipidemia: Secondary | ICD-10-CM | POA: Diagnosis not present

## 2022-12-17 DIAGNOSIS — N898 Other specified noninflammatory disorders of vagina: Secondary | ICD-10-CM

## 2022-12-17 DIAGNOSIS — R739 Hyperglycemia, unspecified: Secondary | ICD-10-CM | POA: Diagnosis not present

## 2022-12-17 DIAGNOSIS — Z114 Encounter for screening for human immunodeficiency virus [HIV]: Secondary | ICD-10-CM

## 2022-12-17 DIAGNOSIS — Z0000 Encounter for general adult medical examination without abnormal findings: Secondary | ICD-10-CM

## 2022-12-17 DIAGNOSIS — F419 Anxiety disorder, unspecified: Secondary | ICD-10-CM

## 2022-12-17 DIAGNOSIS — Z1159 Encounter for screening for other viral diseases: Secondary | ICD-10-CM

## 2022-12-17 LAB — LIPID PANEL
Cholesterol: 261 mg/dL — ABNORMAL HIGH (ref 0–200)
HDL: 45.3 mg/dL (ref >39.00)
NonHDL: 215.9
Total CHOL/HDL Ratio: 6
Triglycerides: 279 mg/dL — ABNORMAL HIGH (ref 0.0–149.0)
VLDL: 55.8 mg/dL — ABNORMAL HIGH (ref 0.0–40.0)

## 2022-12-17 LAB — CBC WITH DIFFERENTIAL/PLATELET
Basophils Absolute: 0.1 10*3/uL (ref 0.0–0.1)
Basophils Relative: 0.6 % (ref 0.0–3.0)
Eosinophils Absolute: 0.3 10*3/uL (ref 0.0–0.7)
Eosinophils Relative: 2.8 % (ref 0.0–5.0)
HCT: 42.1 % (ref 36.0–46.0)
Hemoglobin: 13.5 g/dL (ref 12.0–15.0)
Lymphocytes Relative: 35.5 % (ref 12.0–46.0)
Lymphs Abs: 3.2 10*3/uL (ref 0.7–4.0)
MCHC: 32.1 g/dL (ref 30.0–36.0)
MCV: 86.4 fl (ref 78.0–100.0)
Monocytes Absolute: 0.5 10*3/uL (ref 0.1–1.0)
Monocytes Relative: 5.3 % (ref 3.0–12.0)
Neutro Abs: 5 10*3/uL (ref 1.4–7.7)
Neutrophils Relative %: 55.8 % (ref 43.0–77.0)
Platelets: 299 10*3/uL (ref 150.0–400.0)
RBC: 4.87 Mil/uL (ref 3.87–5.11)
RDW: 15.1 % (ref 11.5–15.5)
WBC: 8.9 10*3/uL (ref 4.0–10.5)

## 2022-12-17 LAB — COMPREHENSIVE METABOLIC PANEL
ALT: 17 U/L (ref 0–35)
AST: 16 U/L (ref 0–37)
Albumin: 4.5 g/dL (ref 3.5–5.2)
Alkaline Phosphatase: 74 U/L (ref 39–117)
BUN: 12 mg/dL (ref 6–23)
CO2: 31 mEq/L (ref 19–32)
Calcium: 9.5 mg/dL (ref 8.4–10.5)
Chloride: 101 mEq/L (ref 96–112)
Creatinine, Ser: 0.73 mg/dL (ref 0.40–1.20)
GFR: 91.78 mL/min (ref >60.00)
Glucose, Bld: 88 mg/dL (ref 70–99)
Potassium: 4.3 mEq/L (ref 3.5–5.1)
Sodium: 139 mEq/L (ref 135–145)
Total Bilirubin: 0.7 mg/dL (ref 0.2–1.2)
Total Protein: 6.8 g/dL (ref 6.0–8.3)

## 2022-12-17 LAB — HEMOGLOBIN A1C: Hgb A1c MFr Bld: 6.4 % (ref 4.6–6.5)

## 2022-12-17 LAB — TSH: TSH: 2.61 u[IU]/mL (ref 0.35–5.50)

## 2022-12-17 LAB — LDL CHOLESTEROL, DIRECT: Direct LDL: 209 mg/dL

## 2022-12-17 NOTE — Progress Notes (Signed)
New Patient Office Visit  Subjective    Patient ID: Tara Walton, female    DOB: [birth year]  Age: 58 y.o. MRN: [id number]  CC:  Chief Complaint  Patient presents with   Establish Care    HPI Tara Walton presents to establish care  Discussed the use of AI scribe software for clinical note transcription with the patient, who gave verbal consent to proceed.  History of Present Illness   The patient presents as a new patient seeking primary care after previously receiving care at an urgent care facility. She has a history of high cholesterol, which she has managed without medication, and hypertension, for which she takes Lisinopril 20 mg daily. She reports no side effects from the Lisinopril, and her blood pressure is regularly monitored at home. She also has a history of prediabetes, with an A1C of 6.2 six months ago.  Recently, the patient has been experiencing vulvar itching since the previous Saturday, with no associated rash, blisters, or drainage. She attempted over-the-counter yeast infection treatment without relief.  The patient also reports occasional work-related stress and anxiety, for which she takes Xanax 0.25 mg as needed, approximately five times in the past six months. She denies any need for counseling or thoughts of self-harm.  In terms of surgical history, the patient has had C-sections and tubal ligation. She has no personal/family history of thyroid cancer.. She has received orthopedic care in the past, including injections in the knee and foot.  The patient has not had a colonoscopy and expresses reluctance to undergo this procedure; also declines Cologuard at this time. She has no family history of colon cancer. She is a former smoker, having quit ten years ago.  Lastly, the patient expresses a desire for weight loss assistance, particularly if her A1C is found to be in the diabetes range.           12/17/2022   10:39 AM  PHQ9 SCORE ONLY  PHQ-9 Total  Score 2      12/17/2022   10:39 AM  GAD 7 : Generalized Anxiety Score  Nervous, Anxious, on Edge 0  Control/stop worrying 1  Worry too much - different things 2  Trouble relaxing 2  Restless 0  Easily annoyed or irritable 2  Afraid - awful might happen 0  Total GAD 7 Score 7  Anxiety Difficulty Somewhat difficult       Outpatient Encounter Medications as of 12/17/2022  Medication Sig   ALPRAZolam (XANAX) 0.5 MG tablet Take 0.5 mg by mouth daily as needed for anxiety.   lisinopril (PRINIVIL,ZESTRIL) 20 MG tablet Take 20 mg by mouth daily.   [DISCONTINUED] aspirin 81 MG chewable tablet Chew 81 mg by mouth daily. (Patient not taking: Reported on 08/07/2021)   [DISCONTINUED] ibuprofen (ADVIL) 600 MG tablet Take 1 tablet (600 mg total) by mouth every 6 (six) hours as needed.   [DISCONTINUED] methocarbamol (ROBAXIN) 500 MG tablet Take 1 tablet (500 mg total) by mouth 2 (two) times daily.   [DISCONTINUED] omeprazole (PRILOSEC) 20 MG capsule Take 1 capsule (20 mg total) by mouth daily. (Patient not taking: Reported on 08/07/2021)   [DISCONTINUED] predniSONE (DELTASONE) 50 MG tablet Take 1 tablet (50 mg total) by mouth daily with breakfast.   No facility-administered encounter medications on file as of 12/17/2022.    Past Medical History:  Diagnosis Date   Allergies    Anxiety    Arthritis    Depression    Hypercholesteremia    Hypertension  Prediabetes     Past Surgical History:  Procedure Laterality Date   CESAREAN SECTION     TUBAL LIGATION      Family History  Problem Relation Age of Onset   COPD Mother    Depression Mother    Drug abuse Mother    Alcohol abuse Father    Hypertension Maternal Grandmother    Hyperlipidemia Maternal Grandmother    Diabetes Maternal Grandfather    Alcohol abuse Maternal Grandfather    Cancer Paternal Grandmother    Alcohol abuse Paternal Grandmother    Hyperlipidemia Paternal Grandfather    Hypertension Son    Hyperlipidemia Son      Social History   Socioeconomic History   Marital status: Married    Spouse name: Not on file   Number of children: Not on file   Years of education: Not on file   Highest education level: Not on file  Occupational History   Not on file  Tobacco Use   Smoking status: Former    Current packs/day: 0.00    Types: Cigarettes    Quit date: 02/29/2012    Years since quitting: 10.8   Smokeless tobacco: Never  Vaping Use   Vaping status: Never Used  Substance and Sexual Activity   Alcohol use: No   Drug use: No   Sexual activity: Yes    Birth control/protection: Post-menopausal  Other Topics Concern   Not on file  Social History Narrative   Not on file   Social Determinants of Health   Financial Resource Strain: Not on file  Food Insecurity: No Food Insecurity (08/07/2021)   Hunger Vital Sign    Worried About Running Out of Food in the Last Year: Never true    Ran Out of Food in the Last Year: Never true  Transportation Needs: No Transportation Needs (08/07/2021)   PRAPARE - Administrator, Civil Service (Medical): No    Lack of Transportation (Non-Medical): No  Physical Activity: Not on file  Stress: Not on file  Social Connections: Unknown (12/22/2021)   Received from [facility]   Social Network    Social Network: Not on file  Intimate Partner Violence: Unknown (12/22/2021)   Received from [facility]   HITS    Physically Hurt: Not on file    Insult or Talk Down To: Not on file    Threaten Physical Harm: Not on file    Scream or Curse: Not on file    ROS All review of systems negative except what is listed in the HPI      Objective    BP 136/63   Pulse 72   Ht 5\' 5"  (1.651 m)   Wt 222 lb (100.7 kg)   LMP 02/03/2015 (Approximate)   SpO2 98%   BMI 36.94 kg/m   Physical Exam Vitals reviewed.  Constitutional:      Appearance: Normal appearance. She is obese.  Cardiovascular:     Rate and Rhythm: Normal  rate and regular rhythm.     Heart sounds: Normal heart sounds.  Pulmonary:     Effort: Pulmonary effort is normal.     Breath sounds: Normal breath sounds.  Genitourinary:    Comments: Deferred, self swab Skin:    General: Skin is warm and dry.  Neurological:     Mental Status: She is alert and oriented to person, place, and time.  Psychiatric:        Mood and Affect: Mood  normal.        Behavior: Behavior normal.        Thought Content: Thought content normal.        Judgment: Judgment normal.         Assessment & Plan:   Problem List Items Addressed This Visit     Mixed hyperlipidemia    Medication management: none Lifestyle factors for lowering cholesterol include: Diet therapy - heart-healthy diet rich in fruits, veggies, fiber-rich whole grains, lean meats, chicken, fish (at least twice a week), fat-free or 1% dairy products; foods low in saturated/trans fats, cholesterol, sodium, and sugar. Mediterranean diet has shown to be very heart healthy. Regular exercise - recommend at least 30 minutes a day, 5 times per week Weight management  Repeat CMP and lipid panel today       Relevant Orders   Comprehensive metabolic panel   Lipid panel   Primary hypertension (Chronic)    Blood pressure is at goal for age and co-morbidities.   Recommendations: continue lisinopril 20 mg daily - BP goal <130/80 - monitor and log blood pressures at home - check around the same time each day in a relaxed setting - Limit salt to <2000 mg/day - Follow DASH eating plan (heart healthy diet) - limit alcohol to 2 standard drinks per day for men and 1 per day for women - avoid tobacco products - get at least 2 hours of regular aerobic exercise weekly Patient aware of signs/symptoms requiring further/urgent evaluation. Labs updated today.       Relevant Orders   CBC with Differential/Platelet   Comprehensive metabolic panel   Lipid panel   TSH   Hyperglycemia (Chronic)    Reports  history of prediabetes Repeat A1c today Continue with healthy lifestyle      Relevant Orders   Hemoglobin A1c   Anxiety (Chronic)    Patient reports very rare Xanax use. Maybe once a month or so. Does not currently need a refill.  Contract updated today. UDS at next refill.       Relevant Medications   ALPRAZolam (XANAX) 0.5 MG tablet   Other Visit Diagnoses     Encounter for medical examination to establish care    -  Primary   Vaginal itching       Relevant Orders   Cervicovaginal ancillary only   Encounter for screening for HIV       Relevant Orders   HIV Antibody (routine testing w rflx)   Encounter for hepatitis C screening test for low risk patient       Relevant Orders   Hepatitis C antibody          Return in about 6 months (around 06/19/2023) for routine follow-up.   Clayborne Dana, NP

## 2022-12-17 NOTE — Assessment & Plan Note (Signed)
Reports history of prediabetes Repeat A1c today Continue with healthy lifestyle

## 2022-12-17 NOTE — Assessment & Plan Note (Signed)
 Blood pressure is at goal for age and co-morbidities.   Recommendations: continue lisinopril 20 mg daily - BP goal <130/80 - monitor and log blood pressures at home - check around the same time each day in a relaxed setting - Limit salt to <2000 mg/day - Follow DASH eating plan (heart healthy diet) - limit alcohol to 2 standard drinks per day for men and 1 per day for women - avoid tobacco products - get at least 2 hours of regular aerobic exercise weekly Patient aware of signs/symptoms requiring further/urgent evaluation. Labs updated today.

## 2022-12-17 NOTE — Assessment & Plan Note (Signed)
Medication management: none Lifestyle factors for lowering cholesterol include: Diet therapy - heart-healthy diet rich in fruits, veggies, fiber-rich whole grains, lean meats, chicken, fish (at least twice a week), fat-free or 1% dairy products; foods low in saturated/trans fats, cholesterol, sodium, and sugar. Mediterranean diet has shown to be very heart healthy. Regular exercise - recommend at least 30 minutes a day, 5 times per week Weight management  Repeat CMP and lipid panel today

## 2022-12-17 NOTE — Assessment & Plan Note (Signed)
Patient reports very rare Xanax use. Maybe once a month or so. Does not currently need a refill.  Contract updated today. UDS at next refill.

## 2022-12-18 LAB — CERVICOVAGINAL ANCILLARY ONLY
Bacterial Vaginitis (gardnerella): NEGATIVE
Candida Glabrata: NEGATIVE
Candida Vaginitis: NEGATIVE
Comment: NEGATIVE
Comment: NEGATIVE
Comment: NEGATIVE

## 2022-12-18 LAB — HEPATITIS C ANTIBODY: Hepatitis C Ab: NONREACTIVE

## 2022-12-18 LAB — HIV ANTIBODY (ROUTINE TESTING W REFLEX): HIV 1&2 Ab, 4th Generation: NONREACTIVE

## 2022-12-21 ENCOUNTER — Encounter: Payer: Self-pay | Admitting: Family Medicine

## 2022-12-21 NOTE — Addendum Note (Signed)
Addended by: Hyman Hopes B on: 12/21/2022 02:27 PM   Modules accepted: Orders

## 2022-12-21 NOTE — Progress Notes (Signed)
Labs are stable other than high cholesterol. Since you were not fasting. Let's check again fasting in 2 weeks to see if this was an accurate test.   Lifestyle factors for lowering cholesterol include: Diet therapy - heart-healthy diet rich in fruits, veggies, fiber-rich whole grains, lean meats, chicken, fish (at least twice a week), fat-free or 1% dairy products; foods low in saturated/trans fats, cholesterol, sodium, and sugar. Mediterranean diet has shown to be very heart healthy. Regular exercise - recommend at least 30 minutes a day, 5 times per week Weight management

## 2023-01-12 ENCOUNTER — Encounter: Payer: Self-pay | Admitting: Family Medicine

## 2023-01-12 ENCOUNTER — Ambulatory Visit (INDEPENDENT_AMBULATORY_CARE_PROVIDER_SITE_OTHER): Payer: BC Managed Care – PPO | Admitting: Family Medicine

## 2023-01-12 VITALS — BP 125/55 | HR 70 | Ht 65.0 in | Wt 219.0 lb

## 2023-01-12 DIAGNOSIS — E782 Mixed hyperlipidemia: Secondary | ICD-10-CM | POA: Diagnosis not present

## 2023-01-12 DIAGNOSIS — L309 Dermatitis, unspecified: Secondary | ICD-10-CM | POA: Diagnosis not present

## 2023-01-12 LAB — LIPID PANEL
Cholesterol: 261 mg/dL — ABNORMAL HIGH (ref 0–200)
HDL: 52.4 mg/dL (ref >39.00)
LDL Cholesterol: 165 mg/dL — ABNORMAL HIGH (ref 0–99)
NonHDL: 208.55
Total CHOL/HDL Ratio: 5
Triglycerides: 217 mg/dL — ABNORMAL HIGH (ref 0.0–149.0)
VLDL: 43.4 mg/dL — ABNORMAL HIGH (ref 0.0–40.0)

## 2023-01-12 MED ORDER — TRIAMCINOLONE ACETONIDE 0.1 % EX CREA: 1.0000 | TOPICAL_CREAM | Freq: Two times a day (BID) | CUTANEOUS | 0 refills | Status: DC

## 2023-01-12 NOTE — Assessment & Plan Note (Signed)
-  Check fasting lipid panel today. -Discussed diet and exercise plan to lower cholesterol.

## 2023-01-12 NOTE — Progress Notes (Signed)
Acute Office Visit  Subjective:     Patient ID: Tara Walton, female    DOB: [birth year], 57 y.o.   MRN: [id number]  Chief Complaint  Patient presents with   Medical Management of Chronic Issues    HPI Patient is in today for rash  Discussed the use of AI scribe software for clinical note transcription with the patient, who gave verbal consent to proceed.  History of Present Illness   The patient presents with a recurrent, localized, pruritic rash that has been previously evaluated and tested negative for infection. The rash, initially located in the groin area, but she has also noticed a suprapubic area of redness/itching near her c-section scare where her pants rub. The patient has been managing the symptoms with a previously prescribed triamcinolone 0.1% cream, which she reports to be effective in resolving the rash after a few days of application.   The patient suspects an allergic reaction to certain soaps, toilet paper, or laundry detergents as the cause of the rash, as she has a known history of sensitivity to "loud" soaps and detergents. She reports using Dove soap and low-scent Gain laundry detergent at home. The rash seems to occur in areas where clothing fits tighter, such as the groin and around the waistline.  The patient also reports a localized itching sensation above her C-section scar. She denies any internal vaginal symptoms such as itching, pain, discharge, or increased urination. The patient's symptoms seem to improve with the application of the prescribed cream.  The patient has been making dietary changes in an attempt to lower her high cholesterol levels, including eliminating beef and pork from her diet and increasing vegetable intake. She reports a recent weight loss from these changes.         Lab Results  Component Value Date   HGBA1C 6.4 12/17/2022       ROS All review of systems negative except what is listed in the HPI      Objective:     BP (!) 125/55   Pulse 70   Ht 5\' 5"  (1.651 m)   Wt 219 lb (99.3 kg)   LMP 02/03/2015 (Approximate)   SpO2 98%   BMI 36.44 kg/m    Physical Exam Vitals reviewed. Exam conducted with a chaperone present.  Constitutional:      Appearance: Normal appearance.  Genitourinary:    General: Normal vulva.     Labia:        Right: No rash, tenderness, lesion or injury.        Left: No rash, tenderness, lesion or injury.   Skin:    General: Skin is warm and dry.     Comments: Mild erythematous rash to suprapubic area  Neurological:     Mental Status: She is alert and oriented to person, place, and time.  Psychiatric:        Mood and Affect: Mood normal.        Behavior: Behavior normal.        Thought Content: Thought content normal.      No results found for any visits on 01/12/23.      Assessment & Plan:   Problem List Items Addressed This Visit       Active Problems   Mixed hyperlipidemia    -Check fasting lipid panel today. -Discussed diet and exercise plan to lower cholesterol.       Other Visit Diagnoses     Dermatitis    -  Primary Contact  Dermatitis Recurrent itching and rash in the groin and lower abdomen. No internal vaginal symptoms. The rash responds to intermittent use of Triamcinolone 0.1% cream. The patient has a history of allergies to soaps and detergents. The rash is located in areas where clothing is tight fitting. -Continue Triamcinolone 0.1% cream as needed, but not more than 7 days per month. -Switch to a hypoallergenic laundry detergent.   Relevant Medications   triamcinolone cream (KENALOG) 0.1 %       Meds ordered this encounter  Medications   triamcinolone cream (KENALOG) 0.1 %    Sig: Apply 1 Application topically 2 (two) times daily.    Dispense:  30 g    Refill:  0    Order Specific Question:   Supervising Provider    Answer:   Danise Edge A [[id number]]    Return if symptoms worsen or fail to improve.  Clayborne Dana,  NP

## 2023-01-13 NOTE — Progress Notes (Signed)
Cholesterol is still high, but LDL is slightly better compared to the last check. Your risk is still relatively low for a cardiac event in the next 10 years (see scoring below). We typically will start medicine if that score is above 7% or if LDL is above 190. So we do not have to start medication right away, but we do need to focus on lifestyle measures and keep an eye on this.   Lifestyle factors for lowering cholesterol include: Diet therapy - heart-healthy diet rich in fruits, veggies, fiber-rich whole grains, lean meats, chicken, fish (at least twice a week), fat-free or 1% dairy products; foods low in saturated/trans fats, cholesterol, sodium, and sugar. Mediterranean diet has shown to be very heart healthy. Regular exercise - recommend at least 30 minutes a day, 5 times per week Weight management    The 10-year ASCVD risk score (Arnett DK, et al., 2019) is: 3.8%   Values used to calculate the score:     Age: 10 years     Sex: Female     Is Non-Hispanic African American: No     Diabetic: No     Tobacco smoker: No     Systolic Blood Pressure: 125 mmHg     Is BP treated: Yes     HDL Cholesterol: 52.4 mg/dL     Total Cholesterol: 261 mg/dL

## 2023-02-01 ENCOUNTER — Other Ambulatory Visit: Payer: Self-pay | Admitting: Family Medicine

## 2023-02-01 MED ORDER — LISINOPRIL 20 MG PO TABS: 20.0000 mg | ORAL_TABLET | Freq: Every day | ORAL | 1 refills | Status: DC

## 2023-04-02 ENCOUNTER — Encounter: Payer: Self-pay | Admitting: Family Medicine

## 2023-04-07 ENCOUNTER — Ambulatory Visit (INDEPENDENT_AMBULATORY_CARE_PROVIDER_SITE_OTHER): Payer: BC Managed Care – PPO | Admitting: Family Medicine

## 2023-04-07 ENCOUNTER — Other Ambulatory Visit (HOSPITAL_COMMUNITY)
Admission: RE | Admit: 2023-04-07 | Discharge: 2023-04-07 | Disposition: A | Discharge status: Home / Self Care | Payer: BC Managed Care – PPO | Source: Ambulatory Visit | Attending: Family Medicine | Admitting: Family Medicine

## 2023-04-07 ENCOUNTER — Encounter: Payer: Self-pay | Admitting: Family Medicine

## 2023-04-07 VITALS — BP 104/60 | HR 76 | Ht 65.0 in | Wt 223.0 lb

## 2023-04-07 DIAGNOSIS — N898 Other specified noninflammatory disorders of vagina: Secondary | ICD-10-CM | POA: Insufficient documentation

## 2023-04-07 MED ORDER — FLUCONAZOLE 150 MG PO TABS: 150.0000 mg | ORAL_TABLET | Freq: Every day | ORAL | 0 refills | Status: DC

## 2023-04-07 NOTE — Progress Notes (Signed)
Acute Office Visit  Subjective:     Patient ID: Tara Walton, female    DOB: [birth year], 57 y.o.   MRN: [id number]  Chief Complaint  Patient presents with   Vaginal Itching    Patient is in today for vaginal itching and discharge.    Discussed the use of AI scribe software for clinical note transcription with the patient, who gave verbal consent to proceed.  History of Present Illness   The patient presents with persistent vaginal itching that has been ongoing for approximately three weeks. Despite two courses of over-the-counter treatment (Monistat), the symptoms have not resolved. The first treatment was a generic brand, and the second was Monistat one. Each treatment was followed by a three-day break, but the itching returned promptly. The patient describes the itching as primarily external, with no associated odor.  In addition to the itching, the patient reports an abnormal, white, milky vaginal discharge, which is not typical for her. She has a history of recurrent yeast infections, but the current symptoms have not responded to her usual treatment.  The patient also mentions a sensitivity to laundry detergents and soaps with perfumes, which can cause itching, particularly in the genital area. She has been using cream (triamcinolone 0.1%) on the clitoris and external genital area to manage the itching.  The patient denies any abdominal pain, fever, or concerns for sexually transmitted diseases. She has been in a monogamous relationship for 35 years.          ROS All review of systems negative except what is listed in the HPI      Objective:    BP 104/60   Pulse 76   Ht 5\' 5"  (1.651 m)   Wt 223 lb (101.2 kg)   LMP 02/03/2015 (Approximate)   SpO2 98%   BMI 37.11 kg/m    Physical Exam Vitals reviewed.  Constitutional:      General: She is not in acute distress.    Appearance: Normal appearance. She is not ill-appearing.  Neurological:     Mental Status:  She is alert and oriented to person, place, and time.  Psychiatric:        Mood and Affect: Mood normal.        Behavior: Behavior normal.        Thought Content: Thought content normal.        Judgment: Judgment normal.     No results found for any visits on 04/07/23.      Assessment & Plan:   Problem List Items Addressed This Visit   None Visit Diagnoses     Vaginal itching    -  Primary   Relevant Medications   fluconazole (DIFLUCAN) 150 MG tablet   Other Relevant Orders   Cervicovaginal ancillary only       Persistent for 3 weeks despite treatment with Monistat. Noted to have white, milky discharge. No odor. Itching primarily external. No rash, but area is red. No concern for STDs. -Collect vaginal swab for testing to identify cause of symptoms. -Will go ahead and treat with Diflucan while waiting for results.   History of sensitivity to laundry detergents and soaps causing itching, primarily in the genital area.  -Recommended trial of all clean and clear laundry detergent to minimize potential irritants.        Meds ordered this encounter  Medications   fluconazole (DIFLUCAN) 150 MG tablet    Sig: Take 1 tablet (150 mg total) by mouth daily. May repeat in  3 days if needed.    Dispense:  2 tablet    Refill:  0    Order Specific Question:   Supervising Provider    Answer:   Danise Edge A [[id number]]    Return if symptoms worsen or fail to improve.  Clayborne Dana, NP

## 2023-04-09 LAB — CERVICOVAGINAL ANCILLARY ONLY
Bacterial Vaginitis (gardnerella): NEGATIVE
Candida Glabrata: NEGATIVE
Candida Vaginitis: NEGATIVE
Comment: NEGATIVE
Comment: NEGATIVE
Comment: NEGATIVE

## 2023-04-11 ENCOUNTER — Encounter: Payer: Self-pay | Admitting: Family Medicine

## 2023-04-11 DIAGNOSIS — N898 Other specified noninflammatory disorders of vagina: Secondary | ICD-10-CM

## 2023-05-16 ENCOUNTER — Other Ambulatory Visit: Payer: Self-pay | Admitting: Family Medicine

## 2023-05-16 DIAGNOSIS — L309 Dermatitis, unspecified: Secondary | ICD-10-CM

## 2023-06-21 ENCOUNTER — Ambulatory Visit: Payer: BC Managed Care – PPO | Admitting: Family Medicine

## 2023-07-01 ENCOUNTER — Ambulatory Visit: Payer: BC Managed Care – PPO | Admitting: Family Medicine

## 2023-07-14 ENCOUNTER — Ambulatory Visit (INDEPENDENT_AMBULATORY_CARE_PROVIDER_SITE_OTHER): Admitting: Family Medicine

## 2023-07-14 ENCOUNTER — Encounter: Payer: Self-pay | Admitting: Family Medicine

## 2023-07-14 VITALS — BP 137/65 | HR 70 | Ht 65.0 in | Wt 225.0 lb

## 2023-07-14 DIAGNOSIS — E782 Mixed hyperlipidemia: Secondary | ICD-10-CM | POA: Diagnosis not present

## 2023-07-14 DIAGNOSIS — I10 Essential (primary) hypertension: Secondary | ICD-10-CM

## 2023-07-14 DIAGNOSIS — R5383 Other fatigue: Secondary | ICD-10-CM

## 2023-07-14 DIAGNOSIS — R1013 Epigastric pain: Secondary | ICD-10-CM

## 2023-07-14 DIAGNOSIS — R739 Hyperglycemia, unspecified: Secondary | ICD-10-CM | POA: Diagnosis not present

## 2023-07-14 DIAGNOSIS — R7303 Prediabetes: Secondary | ICD-10-CM

## 2023-07-14 DIAGNOSIS — F419 Anxiety disorder, unspecified: Secondary | ICD-10-CM | POA: Diagnosis not present

## 2023-07-14 LAB — COMPREHENSIVE METABOLIC PANEL
ALT: 22 U/L (ref 0–35)
AST: 17 U/L (ref 0–37)
Albumin: 4.5 g/dL (ref 3.5–5.2)
Alkaline Phosphatase: 79 U/L (ref 39–117)
BUN: 15 mg/dL (ref 6–23)
CO2: 30 meq/L (ref 19–32)
Calcium: 9.7 mg/dL (ref 8.4–10.5)
Chloride: 103 meq/L (ref 96–112)
Creatinine, Ser: 0.74 mg/dL (ref 0.40–1.20)
GFR: 89.93 mL/min (ref >60.00)
Glucose, Bld: 106 mg/dL — ABNORMAL HIGH (ref 70–99)
Potassium: 4.7 meq/L (ref 3.5–5.1)
Sodium: 141 meq/L (ref 135–145)
Total Bilirubin: 0.7 mg/dL (ref 0.2–1.2)
Total Protein: 6.9 g/dL (ref 6.0–8.3)

## 2023-07-14 LAB — IBC + FERRITIN
Ferritin: 86 ng/mL (ref 10.0–291.0)
Iron: 83 ug/dL (ref 42–145)
Saturation Ratios: 21.4 % (ref 20.0–50.0)
TIBC: 387.8 ug/dL (ref 250.0–450.0)
Transferrin: 277 mg/dL (ref 212.0–360.0)

## 2023-07-14 LAB — HEMOGLOBIN A1C: Hgb A1c MFr Bld: 6.7 % — ABNORMAL HIGH (ref 4.6–6.5)

## 2023-07-14 LAB — LIPID PANEL
Cholesterol: 257 mg/dL — ABNORMAL HIGH (ref 0–200)
HDL: 48.8 mg/dL (ref >39.00)
LDL Cholesterol: 172 mg/dL — ABNORMAL HIGH (ref 0–99)
NonHDL: 207.93
Total CHOL/HDL Ratio: 5
Triglycerides: 180 mg/dL — ABNORMAL HIGH (ref 0.0–149.0)
VLDL: 36 mg/dL (ref 0.0–40.0)

## 2023-07-14 LAB — TSH: TSH: 1.94 u[IU]/mL (ref 0.35–5.50)

## 2023-07-14 LAB — B12 AND FOLATE PANEL
Folate: 14.5 ng/mL (ref >5.9)
Vitamin B-12: 199 pg/mL — ABNORMAL LOW (ref 211–911)

## 2023-07-14 MED ORDER — LISINOPRIL 20 MG PO TABS: 20.0000 mg | ORAL_TABLET | Freq: Every day | ORAL | 1 refills | Status: DC

## 2023-07-14 NOTE — Assessment & Plan Note (Signed)
-  No current medications  -Check fasting lipid panel today. -Discussed diet and exercise plan to lower cholesterol.

## 2023-07-14 NOTE — Progress Notes (Signed)
 Established Patient Office Visit  Subjective   Patient ID: Tara Walton, female    DOB: Mar 08, 1966  Age: 58 y.o. MRN: [id number]  Chief Complaint  Patient presents with   Medical Management of Chronic Issues    HPI  Discussed the use of AI scribe software for clinical note transcription with the patient, who gave verbal consent to proceed.  History of Present Illness The patient presents with fatigue and epigastric fullness.  Fatigue has been present for the past couple of weeks without any recent illness. Sleep is generally good, though slightly disrupted by a recent time change. No headaches, chest pain, dizziness, or lightheadedness. Blood pressure readings at home are good, with occasional low diastolic readings, but without dizziness or lightheadedness. She drinks about two bottles of water a day and does not consume sodas.  She describes a sensation of fullness in the epigastric region, feeling like 'something sitting there' without any burning sensation. No increased burping is noted, but it is unclear if it worsens after eating. She has not eaten today but had a bowel movement after taking Dulcolax, which she describes as a 'good size'. No burning sensation in the epigastric region is reported.  Blood pressure is generally well-controlled on lisinopril 20 mg daily, with occasional low diastolic readings as low as 53, without symptoms of dizziness or lightheadedness. Hydration is limited to about two bottles of water a day, and she does not consume sodas.  Weight has increased from 216 to 225 pounds since the last visit. Physical activity is limited due to knee pain, which requires cortisone shots. She does not follow a specific diet and lacks motivation to change eating habits. Blood sugar is monitored daily, with a fasting level of 118 mg/dL this morning and 93 mg/dL yesterday afternoon after work.  Her husband notes that she snores, but she does not experience gasping for air  or waking up at night. She has a history of smoking for 32 years, which she quit, and notes that she used to stop breathing at night when she smoked.     Hypertension: - Medications: lisinopril 20 mg daily - Compliance: good - Checking BP at home: 120/70s - Denies any SOB, recurrent headaches, CP, vision changes, LE edema, dizziness, palpitations, or medication side effects. - Diet: general - Exercise: none   Hyperlipidemia: - medications: none - compliance: n/a - medication SEs: n/a The 10-year ASCVD risk score (Arnett DK, et al., 2019) is: 5%   Values used to calculate the score:     Age: 20 years     Sex: Female     Is Non-Hispanic African American: No     Diabetic: No     Tobacco smoker: No     Systolic Blood Pressure: 137 mmHg     Is BP treated: Yes     HDL Cholesterol: 52.4 mg/dL     Total Cholesterol: 261 mg/dL   Pre-Diabetes: - Checking glucose at home: 110s fasting - Medications: none - Compliance: n/a - Eye exam: n/a - Foot exam: n/a - Microalbumin: n/a - Denies symptoms of hypoglycemia, polyuria, polydipsia, numbness extremities, foot ulcers/trauma, wounds that are not healing, medication side effects  Lab Results  Component Value Date   HGBA1C 6.4 12/17/2022   Wt Readings from Last 3 Encounters:  07/14/23 225 lb (102.1 kg)  04/07/23 223 lb (101.2 kg)  01/12/23 219 lb (99.3 kg)      Mood follow-up: - Diagnosis: anxiety - Treatment: PRN xanax (rare use) -  Medication side effects: none - SI/HI: none - Update: stable     07/14/2023    8:33 AM 12/17/2022   10:39 AM  PHQ9 SCORE ONLY  PHQ-9 Total Score 8 2      07/14/2023    8:33 AM 12/17/2022   10:39 AM  GAD 7 : Generalized Anxiety Score  Nervous, Anxious, on Edge 0 0  Control/stop worrying 0 1  Worry too much - different things 0 2  Trouble relaxing 0 2  Restless 0 0  Easily annoyed or irritable 1 2  Afraid - awful might happen 0 0  Total GAD 7 Score 1 7  Anxiety Difficulty Not  difficult at all Somewhat difficult          ROS All review of systems negative except what is listed in the HPI    Objective:     BP 137/65   Pulse 70   Ht 5\' 5"  (1.651 m)   Wt 225 lb (102.1 kg)   LMP 02/03/2015 (Approximate)   SpO2 99%   BMI 37.44 kg/m    Physical Exam Vitals reviewed.  Constitutional:      General: She is not in acute distress.    Appearance: Normal appearance. She is obese. She is not ill-appearing.  Cardiovascular:     Rate and Rhythm: Normal rate and regular rhythm.  Pulmonary:     Effort: Pulmonary effort is normal.     Breath sounds: Normal breath sounds.  Musculoskeletal:     Right lower leg: No edema.     Left lower leg: No edema.  Skin:    General: Skin is warm and dry.  Neurological:     Mental Status: She is alert and oriented to person, place, and time.  Psychiatric:        Mood and Affect: Mood normal.        Behavior: Behavior normal.        Thought Content: Thought content normal.        Judgment: Judgment normal.      No results found for any visits on 07/14/23.    The 10-year ASCVD risk score (Arnett DK, et al., 2019) is: 5%    Assessment & Plan:   Problem List Items Addressed This Visit       Active Problems   Mixed hyperlipidemia - Primary (Chronic)   -No current medications  -Check fasting lipid panel today. -Discussed diet and exercise plan to lower cholesterol.       Relevant Medications   lisinopril (ZESTRIL) 20 MG tablet   Other Relevant Orders   Lipid panel   Primary hypertension (Chronic)   Blood pressure is at goal for age and co-morbidities.   Recommendations: continue lisinopril 20 mg daily - BP goal <130/80 - monitor and log blood pressures at home - check around the same time each day in a relaxed setting - Limit salt to <2000 mg/day - Follow DASH eating plan (heart healthy diet) - limit alcohol to 2 standard drinks per day for men and 1 per day for women - avoid tobacco products -  get at least 2 hours of regular aerobic exercise weekly Patient aware of signs/symptoms requiring further/urgent evaluation. Labs updated today.       Relevant Medications   lisinopril (ZESTRIL) 20 MG tablet   Other Relevant Orders   Comprehensive metabolic panel   Hyperglycemia (Chronic)   History of prediabetes. Fasting glucose has been low 100's recently Repeat A1c today Continue with healthy lifestyle  Relevant Orders   Comprehensive metabolic panel   Hemoglobin A1c   Anxiety (Chronic)   Patient reports very rare Xanax use. Maybe once a month or so. Does not currently need a refill.        Other Visit Diagnoses       Fatigue, unspecified type     Persistent fatigue with good sleep quality.  - Order labs  - Advise increasing hydration. - Consider sleep study if labs inconclusive.   Relevant Orders   B12 and Folate Panel   IBC + Ferritin   TSH     Epigastric discomfort     Fullness and pressure in epigastric region. Possible gastritis or gastric irritation. No ulcer evidence. Advised trial of Nexium or Prilosec. - Recommend Nexium or Prilosec once daily for 1-2 weeks, then as needed. - Consider adding Pepcid in the evening if needed. - Advise against lying down within an hour of eating. - Re-evaluate if symptoms persist.       Return in about 6 months (around 01/14/2024) for routine follow-up, physical.    Clayborne Dana, NP

## 2023-07-14 NOTE — Assessment & Plan Note (Signed)
 Blood pressure is at goal for age and co-morbidities.   Recommendations: continue lisinopril 20 mg daily - BP goal <130/80 - monitor and log blood pressures at home - check around the same time each day in a relaxed setting - Limit salt to <2000 mg/day - Follow DASH eating plan (heart healthy diet) - limit alcohol to 2 standard drinks per day for men and 1 per day for women - avoid tobacco products - get at least 2 hours of regular aerobic exercise weekly Patient aware of signs/symptoms requiring further/urgent evaluation. Labs updated today.

## 2023-07-14 NOTE — Assessment & Plan Note (Signed)
 Patient reports very rare Xanax use. Maybe once a month or so. Does not currently need a refill.

## 2023-07-14 NOTE — Assessment & Plan Note (Signed)
 History of prediabetes. Fasting glucose has been low 100's recently Repeat A1c today Continue with healthy lifestyle

## 2023-07-15 ENCOUNTER — Encounter: Payer: Self-pay | Admitting: Family Medicine

## 2023-08-09 ENCOUNTER — Telehealth: Payer: Self-pay

## 2023-08-09 NOTE — Telephone Encounter (Signed)
 Called patient to schedule new patient appointment. Left voicemail with our contact information to call back and schedule.

## 2023-09-09 ENCOUNTER — Telehealth: Payer: Self-pay

## 2023-09-09 NOTE — Telephone Encounter (Signed)
 Patient called in to cancel her appointment for 5/16 due to her husband having a pacemaker placed. She will call back to reschedule later.

## 2023-09-16 ENCOUNTER — Other Ambulatory Visit: Payer: Self-pay | Admitting: Family Medicine

## 2023-09-16 DIAGNOSIS — L309 Dermatitis, unspecified: Secondary | ICD-10-CM

## 2023-09-17 ENCOUNTER — Encounter: Admitting: Obstetrics and Gynecology

## 2023-10-21 IMAGING — MR MR KNEE*R* W/O CM
4 of 7 series · 22 of 40 positions shown · non-contrast
Comparison: Radiographs 07/02/2020.

CLINICAL DATA: Burning in the right knee for 6 months. Patient
reports improvement from injection eight months ago. No recent
injury or prior relevant surgery.

EXAM:
MRI OF THE RIGHT KNEE WITHOUT CONTRAST
TECHNIQUE: Multiplanar, multisequence MR imaging of the knee was performed. No
intravenous contrast was administered.

[Series 3: T2 fat-sat · axial · 4.0mm · 0.29mm/px · z∈[-76,+30]mm · 5 of 25 slices shown]
[im 1/25]
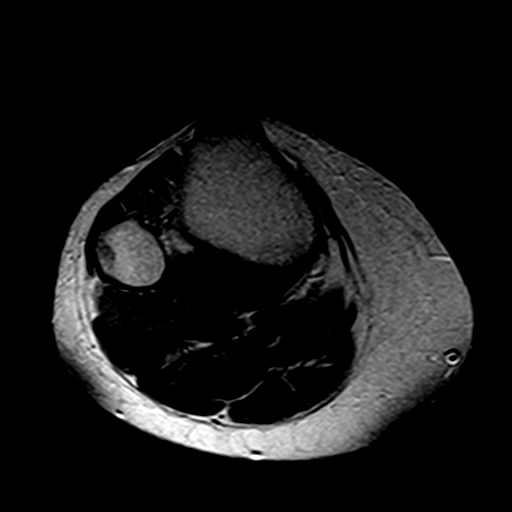
[im 7/25]
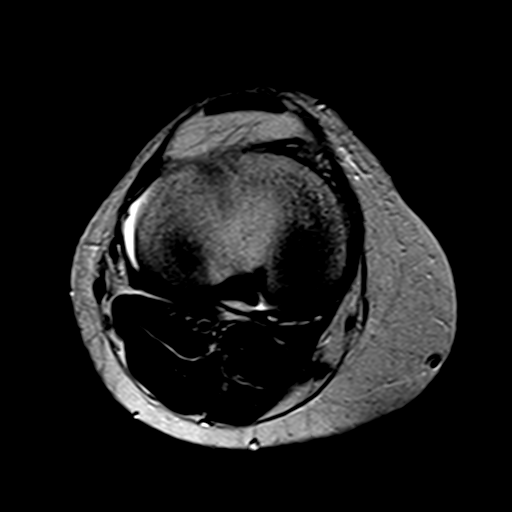
[im 13/25]
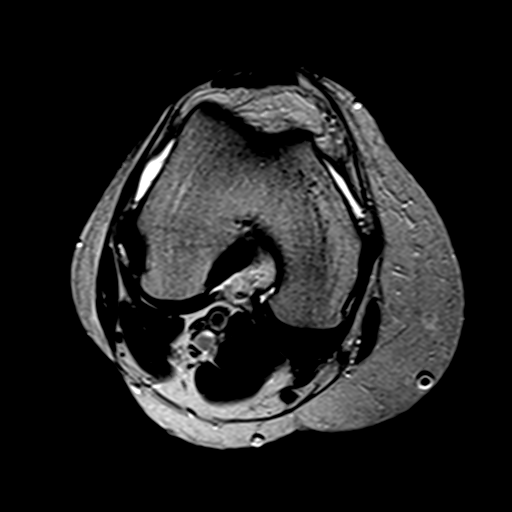
[im 19/25]
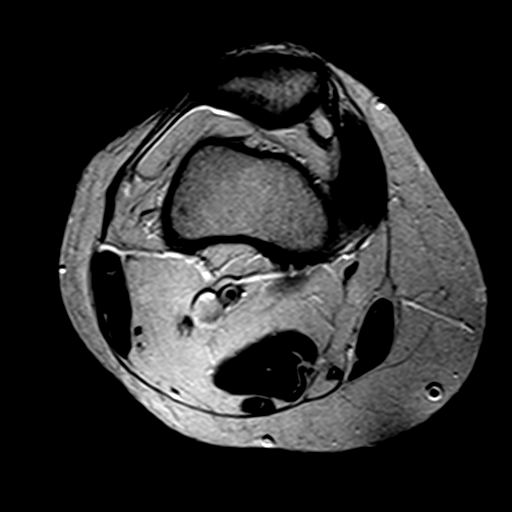
[im 25/25]
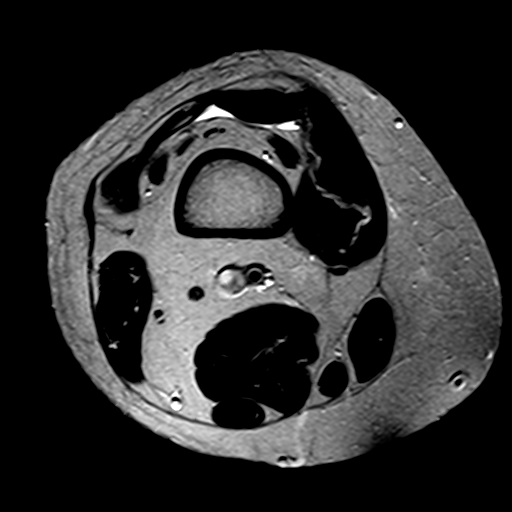

[Series 6: PD fat-sat · coronal · 3.0mm · 0.39mm/px · 7 of 28 slices shown (1 of 3)]
[im 1/28]
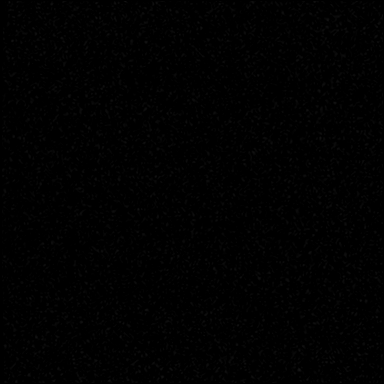
[im 5/28]
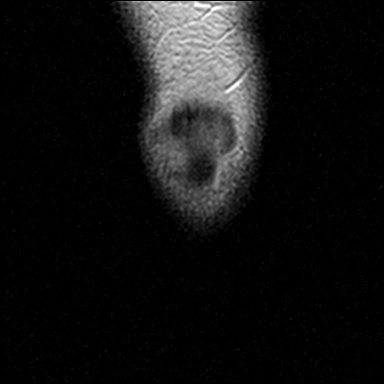
[im 10/28]
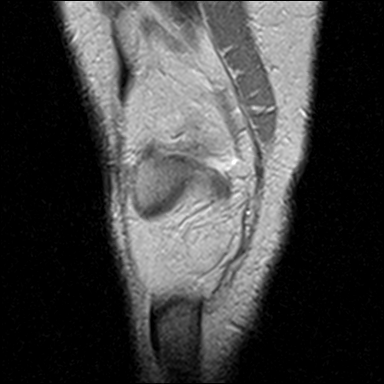
[im 14/28]
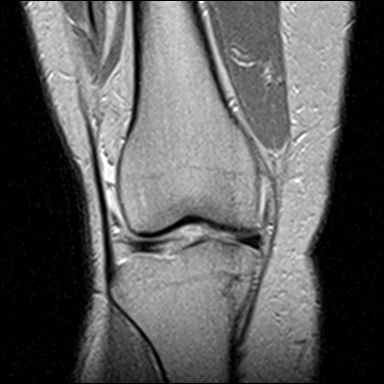
[im 19/28]
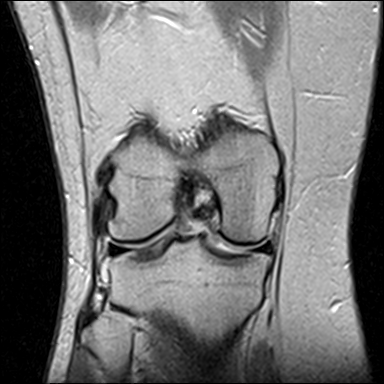
[im 23/28]
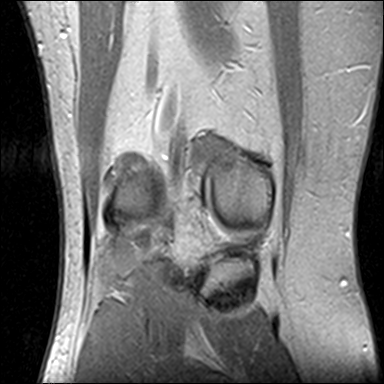
[im 28/28]
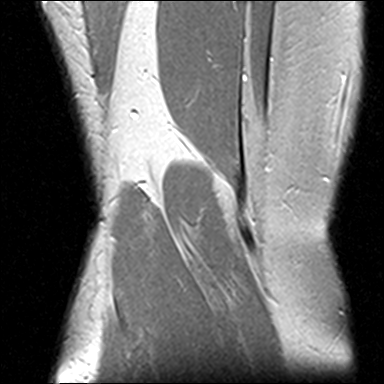

[Series 7: PD fat-sat · sagittal · 3.0mm · 0.29mm/px · 7 of 30 slices shown (2 of 3)]
[im 1/30]
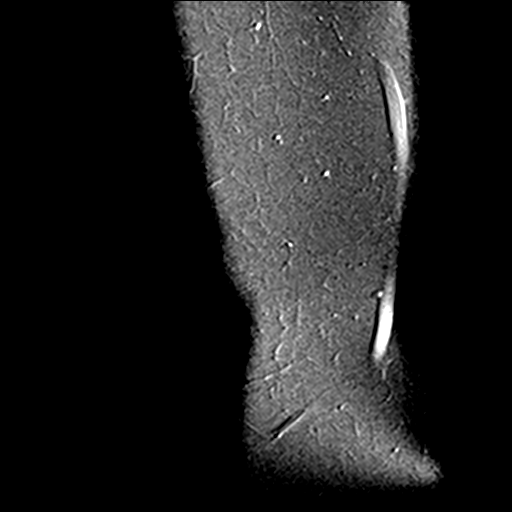
[im 5/30]
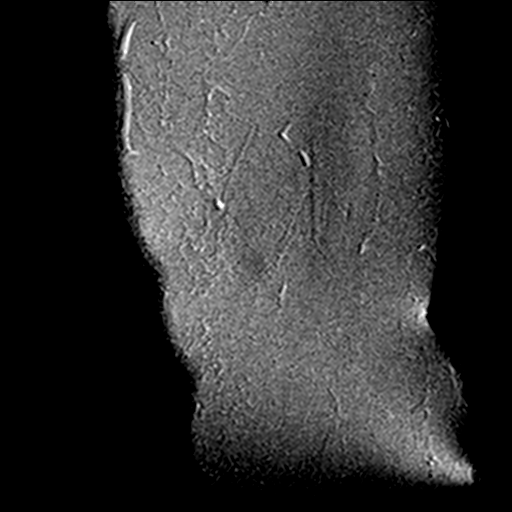
[im 10/30]
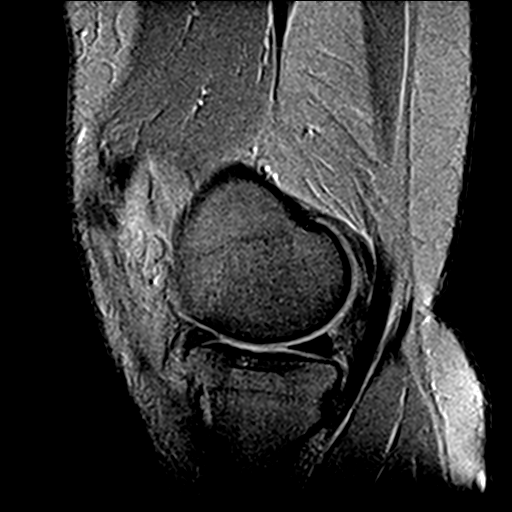
[im 15/30]
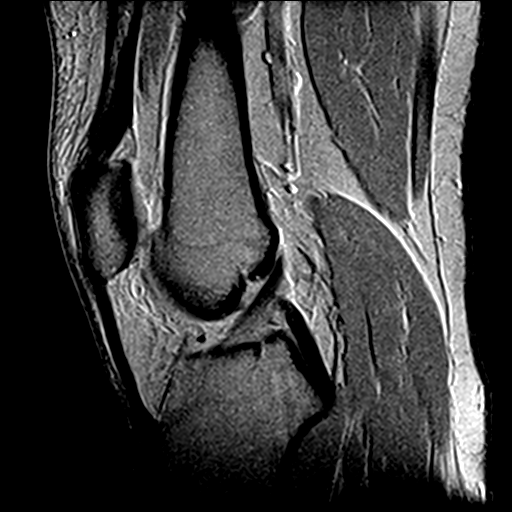
[im 20/30]
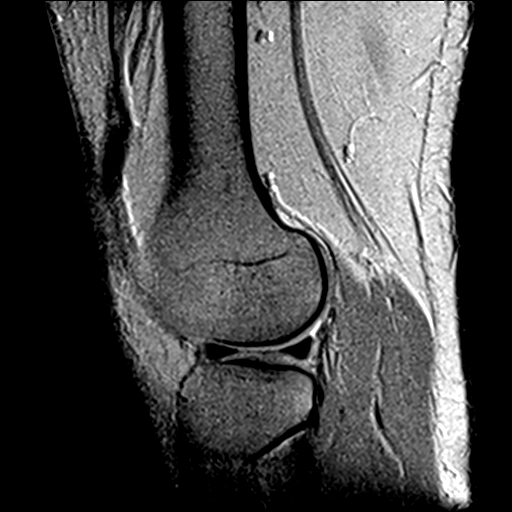
[im 25/30]
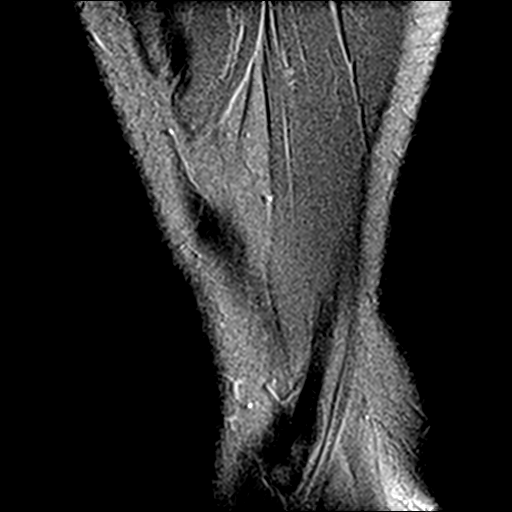
[im 30/30]
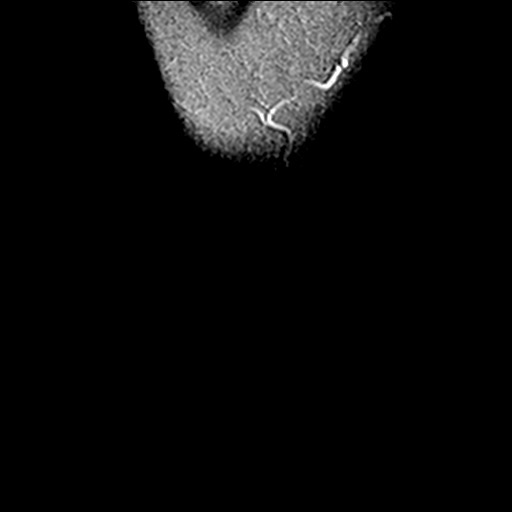

[Series 9: PD fat-sat · oblique · 2.0mm · 0.29mm/px · 3 of 11 slices shown (3 of 3)]
[im 1/11]
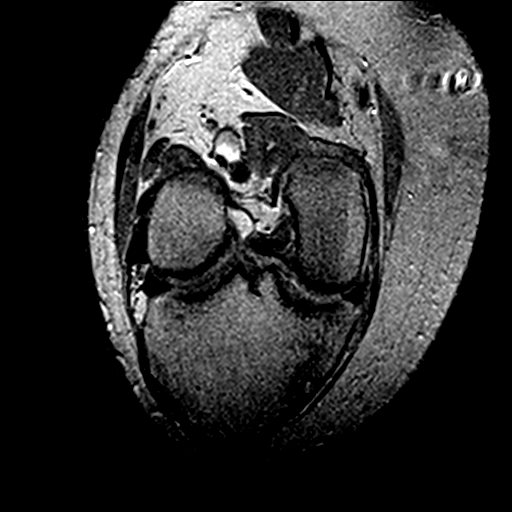
[im 6/11]
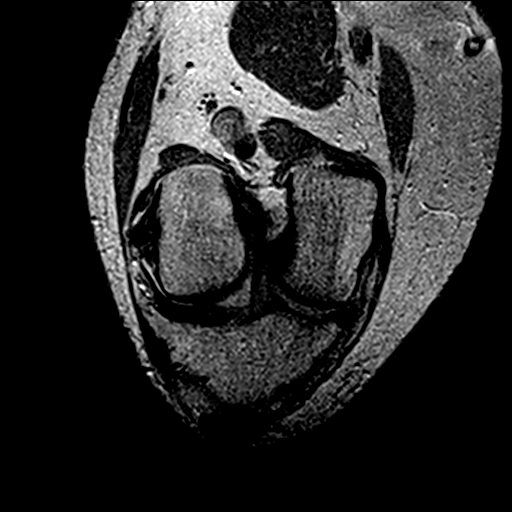
[im 11/11]
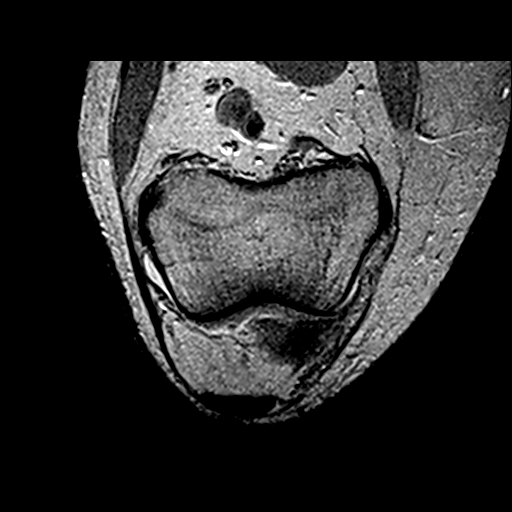

[22 of 40 positions shown; findings below may reference images not displayed]

FINDINGS: MENISCI

Medial meniscus: There is a probable peripheral radial tear
involving the meniscal body, best seen on the coronal images and
sagittal image [DATE]. The meniscal root is intact. No displaced
meniscal fragment.

Lateral meniscus:  Intact with normal morphology.

LIGAMENTS

Cruciates:  Intact.

Collaterals:  Intact.

CARTILAGE

Patellofemoral:  Preserved.

Medial:  Mild chondral thinning without focal defect.

Lateral:  Preserved.

MISCELLANEOUS

Joint:  No significant joint effusion.

Popliteal Fossa: The popliteus muscle and tendon are intact. No
significant Baker's cyst.

Extensor Mechanism:  Intact.

Bones:  No acute or significant extra-articular osseous findings.

Other: No other significant periarticular soft tissue findings.
IMPRESSION: 1. Small radial tear of the body of the medial meniscus. No
displaced meniscal fragment.
2. Minimal underlying medial compartment degenerative chondrosis.
3. The lateral meniscus, cruciate and collateral ligaments are
intact. No acute osseous findings.

## 2023-11-19 ENCOUNTER — Ambulatory Visit (INDEPENDENT_AMBULATORY_CARE_PROVIDER_SITE_OTHER): Admitting: Obstetrics and Gynecology

## 2023-11-19 VITALS — BP 137/71 | HR 72 | Ht 65.0 in | Wt 228.0 lb

## 2023-11-19 DIAGNOSIS — N9089 Other specified noninflammatory disorders of vulva and perineum: Secondary | ICD-10-CM | POA: Diagnosis not present

## 2023-11-19 MED ORDER — TRIAMCINOLONE ACETONIDE 0.025 % EX OINT: 1.0000 | TOPICAL_OINTMENT | Freq: Two times a day (BID) | CUTANEOUS | 0 refills | Status: AC

## 2023-11-19 NOTE — Progress Notes (Signed)
 NEW GYNECOLOGY VISIT Chief Complaint  Patient presents with   Gynecologic Exam    Complaining of vaginal itching x 8 months. Has seen NP at [facility].     Subjective:  Tara Walton is a 58 y.o. who presents for vulvar itching x 8 months.  Reports itching in the vulvar area. Has been treated with antifungal medications without success.   Yeast swab with PCP was negative. Denies pain or dryness with intercourse. All irritation is external in clitoral area. No vaginal bleeding. Has been using triamcinolone  0.1% cream as needed. States she applies it once and it helps for 2-3 days.      OB History   No obstetric history on file.     Past Medical History:  Diagnosis Date   Allergies    Anxiety    Arthritis    Depression    Hypercholesteremia    Hypertension    Prediabetes     Past Surgical History:  Procedure Laterality Date   CESAREAN SECTION     TUBAL LIGATION      Social History   Socioeconomic History   Marital status: Married    Spouse name: Not on file   Number of children: Not on file   Years of education: Not on file   Highest education level: Not on file  Occupational History   Not on file  Tobacco Use   Smoking status: Former    Current packs/day: 0.00    Types: Cigarettes    Quit date: 02/29/2012    Years since quitting: 11.7   Smokeless tobacco: Never  Vaping Use   Vaping status: Never Used  Substance and Sexual Activity   Alcohol  use: No   Drug use: No   Sexual activity: Yes    Birth control/protection: Post-menopausal  Other Topics Concern   Not on file  Social History Narrative   Not on file   Social Drivers of Health   Financial Resource Strain: Not on file  Food Insecurity: No Food Insecurity (08/07/2021)   Hunger Vital Sign    Worried About Running Out of Food in the Last Year: Never true    Ran Out of Food in the Last Year: Never true  Transportation Needs: No Transportation Needs (08/07/2021)   PRAPARE - Therapist, art (Medical): No    Lack of Transportation (Non-Medical): No  Physical Activity: Not on file  Stress: Not on file  Social Connections: Unknown (12/22/2021)   Received from [facility]   Social Network    Social Network: Not on file    Family History  Problem Relation Age of Onset   COPD Mother    Depression Mother    Drug abuse Mother    Alcohol  abuse Father    Hypertension Maternal Grandmother    Hyperlipidemia Maternal Grandmother    Diabetes Maternal Grandfather    Alcohol  abuse Maternal Grandfather    Cancer Paternal Grandmother    Alcohol  abuse Paternal Grandmother    Hyperlipidemia Paternal Grandfather    Hypertension Son    Hyperlipidemia Son     Current Outpatient Medications on File Prior to Visit  Medication Sig Dispense Refill   ALPRAZolam (XANAX) 0.5 MG tablet Take 0.5 mg by mouth daily as needed for anxiety.     lisinopril  (ZESTRIL ) 20 MG tablet Take 1 tablet (20 mg total) by mouth daily. 90 tablet 1   triamcinolone  cream (KENALOG ) 0.1 % APPLY TO AFFECTED AREA TWICE A DAY 30 g  0   No current facility-administered medications on file prior to visit.    Allergies  Allergen Reactions   Hydrochlorothiazide Nausea Only and Swelling     Objective:   Vitals:   11/19/23 1042 11/19/23 1047  BP: (!) 145/69 137/71  Pulse: 71 72  Weight: 228 lb (103.4 kg)   Height: 5' 5 (1.651 m)    Physical Examination:   General appearance - well appearing, and in no distress  Mental status - alert, oriented to person, place, and time  Psych:  normal mood and affect  Skin - warm and dry, normal color, no suspicious lesions noted  Pelvic -  VULVA: normal appearing vulva with no masses, tenderness or lesions   VAGINA: normal appearing vagina with normal color and discharge, no lesions   CERVIX: normal appearing cervix without discharge or lesions   Extremities:  No swelling or varicosities noted  Chaperone present for exam  Assessment and Plan:   1. Vulvar irritation (Primary) No lesions on exam. Likely lichen simplex chronicus causing chronic irritation. Recommend twice daily dosing of triamcinolone  x 2 weeks to break itch/scratch cycle. F/u if symptoms do not abate or return - triamcinolone  (KENALOG ) 0.025 % ointment; Apply 1 Application topically 2 (two) times daily.  Dispense: 30 g; Refill: 0   Future Appointments  Date Time Provider Department Center  11/19/2023 10:55 AM Leslieann Whisman, Rollo DASEN, MD [facility] None    Rollo DASEN Bring, MD, FACOG Obstetrician & Gynecologist, [facility] for [facility], [facility]

## 2024-01-10 ENCOUNTER — Ambulatory Visit: Admitting: Obstetrics and Gynecology

## 2024-01-23 ENCOUNTER — Other Ambulatory Visit: Payer: Self-pay | Admitting: Family Medicine

## 2024-01-23 DIAGNOSIS — I10 Essential (primary) hypertension: Secondary | ICD-10-CM

## 2024-03-01 ENCOUNTER — Other Ambulatory Visit (HOSPITAL_COMMUNITY)
Admission: RE | Admit: 2024-03-01 | Discharge: 2024-03-01 | Disposition: A | Discharge status: Home / Self Care | Source: Ambulatory Visit | Attending: Obstetrics and Gynecology | Admitting: Obstetrics and Gynecology

## 2024-03-01 ENCOUNTER — Ambulatory Visit: Admitting: Obstetrics and Gynecology

## 2024-03-01 VITALS — BP 127/48 | HR 79 | Ht 65.0 in | Wt 228.1 lb

## 2024-03-01 DIAGNOSIS — Z1151 Encounter for screening for human papillomavirus (HPV): Secondary | ICD-10-CM | POA: Diagnosis not present

## 2024-03-01 DIAGNOSIS — Z01419 Encounter for gynecological examination (general) (routine) without abnormal findings: Secondary | ICD-10-CM | POA: Diagnosis not present

## 2024-03-01 DIAGNOSIS — Z8742 Personal history of other diseases of the female genital tract: Secondary | ICD-10-CM | POA: Insufficient documentation

## 2024-03-01 DIAGNOSIS — Z1211 Encounter for screening for malignant neoplasm of colon: Secondary | ICD-10-CM

## 2024-03-01 DIAGNOSIS — Z1231 Encounter for screening mammogram for malignant neoplasm of breast: Secondary | ICD-10-CM | POA: Diagnosis not present

## 2024-03-01 DIAGNOSIS — Z780 Asymptomatic menopausal state: Secondary | ICD-10-CM | POA: Insufficient documentation

## 2024-03-01 DIAGNOSIS — Z13820 Encounter for screening for osteoporosis: Secondary | ICD-10-CM

## 2024-03-01 DIAGNOSIS — Z1331 Encounter for screening for depression: Secondary | ICD-10-CM | POA: Diagnosis not present

## 2024-03-01 DIAGNOSIS — L292 Pruritus vulvae: Secondary | ICD-10-CM | POA: Diagnosis not present

## 2024-03-01 MED ORDER — CLOBETASOL PROPIONATE 0.05 % EX OINT: TOPICAL_OINTMENT | CUTANEOUS | 0 refills | Status: AC

## 2024-03-01 NOTE — Progress Notes (Signed)
 ANNUAL GYNECOLOGY VISIT Chief Complaint  Patient presents with   Gynecologic Exam    Annual exam with pap      Subjective:  Tara Walton is a 58 y.o. who presents for annual exam.  She reports abnormal pap with attempted biopsy that she was unable to tolerate in 2018. Review of records shows AGUS pap 2018 with colposcopy and cervical polyp removal. She never returned for repeat attempt at biopsy.  She also notes that she continues to have vulvar itching/irritation, responds to triamcinolone  ointment then symptoms recur.   Last pap:  Lab Results  Component Value Date   DIAGPAP  08/07/2021    - Negative for intraepithelial lesion or malignancy (NILM)   HPVHIGH Negative 08/07/2021   History of abnormal pap: yes, see above Periods: last periods 15-20 years ago Last mammogram: 2023, she notes balls of tissue in her axilla that she was told were normal Last colonoscopy: never Last DEXA: never, mother has hx hip fracture   The pregnancy intention screening data noted above was reviewed. Potential methods of contraception were discussed. The patient elected to proceed with No data recorded.       03/01/2024    1:14 PM 07/14/2023    8:33 AM 12/17/2022   10:39 AM  Depression screen PHQ 2/9  Decreased Interest 1 2 0  Down, Depressed, Hopeless 0 0 0  PHQ - 2 Score 1 2 0  Altered sleeping 0 0 0  Tired, decreased energy 2 3 1   Change in appetite 0 3 1  Feeling bad or failure about yourself  0 0 0  Trouble concentrating 0 0 0  Moving slowly or fidgety/restless 0 0 0  Suicidal thoughts 0 0 0  PHQ-9 Score 3 8 2   Difficult doing work/chores  Somewhat difficult Not difficult at all        03/01/2024    1:15 PM 07/14/2023    8:33 AM 12/17/2022   10:39 AM  GAD 7 : Generalized Anxiety Score  Nervous, Anxious, on Edge 0 0 0  Control/stop worrying 0 0 1  Worry too much - different things 0 0 2  Trouble relaxing 0 0 2  Restless 0 0 0  Easily annoyed or irritable 1 1 2    Afraid - awful might happen 0 0 0  Total GAD 7 Score 1 1 7   Anxiety Difficulty  Not difficult at all Somewhat difficult      OB History   No obstetric history on file.     Past Medical History:  Diagnosis Date   Allergies    Anxiety    Arthritis    Depression    Hypercholesteremia    Hypertension    Prediabetes     Past Surgical History:  Procedure Laterality Date   CESAREAN SECTION     TUBAL LIGATION      Social History   Socioeconomic History   Marital status: Married    Spouse name: Not on file   Number of children: Not on file   Years of education: Not on file   Highest education level: Not on file  Occupational History   Not on file  Tobacco Use   Smoking status: Former    Current packs/day: 0.00    Types: Cigarettes    Quit date: 02/29/2012    Years since quitting: 12.0   Smokeless tobacco: Never  Vaping Use   Vaping status: Never Used  Substance and Sexual Activity   Alcohol  use: No  Drug use: No   Sexual activity: Yes    Birth control/protection: Post-menopausal  Other Topics Concern   Not on file  Social History Narrative   Not on file   Social Drivers of Health   Financial Resource Strain: Not on file  Food Insecurity: No Food Insecurity (08/07/2021)   Hunger Vital Sign    Worried About Running Out of Food in the Last Year: Never true    Ran Out of Food in the Last Year: Never true  Transportation Needs: No Transportation Needs (08/07/2021)   PRAPARE - Administrator, Civil Service (Medical): No    Lack of Transportation (Non-Medical): No  Physical Activity: Not on file  Stress: Not on file  Social Connections: Unknown (12/22/2021)   Received from [facility]   Social Network    Social Network: Not on file    Family History  Problem Relation Age of Onset   COPD Mother    Depression Mother    Drug abuse Mother    Alcohol  abuse Father    Hypertension Maternal Grandmother    Hyperlipidemia Maternal Grandmother     Diabetes Maternal Grandfather    Alcohol  abuse Maternal Grandfather    Cancer Paternal Grandmother    Alcohol  abuse Paternal Grandmother    Hyperlipidemia Paternal Grandfather    Hypertension Son    Hyperlipidemia Son     Current Outpatient Medications on File Prior to Visit  Medication Sig Dispense Refill   ALPRAZolam (XANAX) 0.5 MG tablet Take 0.5 mg by mouth daily as needed for anxiety.     lisinopril  (ZESTRIL ) 20 MG tablet Take 1 tablet (20 mg total) by mouth daily. APPT FOR FURTHER REFILLS 90 tablet 0   triamcinolone  (KENALOG ) 0.025 % ointment Apply 1 Application topically 2 (two) times daily. 30 g 0   No current facility-administered medications on file prior to visit.    Allergies  Allergen Reactions   Hydrochlorothiazide Nausea Only and Swelling     Objective:   Vitals:   03/01/24 1311  BP: (!) 127/48  Pulse: 79  Weight: 228 lb 1.9 oz (103.5 kg)  Height: 5' 5 (1.651 m)   Physical Examination:   General appearance - well appearing, and in no distress  Mental status - alert, oriented to person, place, and time  Psych:  normal mood and affect  Skin - warm and dry, normal color, no suspicious lesions noted  Breasts - breasts appear normal, no suspicious masses, no skin or nipple changes or  axillary nodes  Abdomen - soft, nontender, nondistended, no masses or organomegaly  Pelvic -  VULVA: normal appearing vulva with no masses, tenderness or lesions   VAGINA: normal appearing vagina with normal color and discharge, no lesions   CERVIX: normal appearing cervix without discharge or lesions, no CMT  Thin prep pap is done with HR HPV cotesting  UTERUS: uterus is felt to be normal size, shape, consistency and nontender   ADNEXA: No adnexal masses or tenderness noted.  Extremities:  No swelling or varicosities noted  Chaperone present for exam  Assessment and Plan:  1. Encounter for well woman exam with routine gynecological exam (Primary) Pap/HPV Mammo  ordered Referral for colonoscopy DEXA ordered given parent with hip fracture  2. Encounter for screening mammogram for breast cancer - MM 3D SCREENING MAMMOGRAM BILATERAL BREAST; Future  3. History of abnormal cervical Pap smear Pap done today, never had uterine sampling for AGUS pap in 2018, patient is amenable to biopsy at  this time. Will return and do paracervical block for biopsy - Cytology - PAP  4. Vulvar itching No lesions on exam, will try clobetasol ointment and f/u response to treatment in 3-4 weeks - clobetasol ointment (TEMOVATE) 0.05 %; Apply to affected area every night for 4 weeks  Dispense: 30 g; Refill: 0  5. Screening for osteoporosis - [facility] DEXA SCAN  6. Colon cancer screening - Ambulatory referral to Gastroenterology   Rollo ONEIDA Bring, MD, FACOG Obstetrician & Gynecologist, [facility] for [facility], [facility]

## 2024-03-01 NOTE — Progress Notes (Signed)
 Patient presents for Annual.  LMP: Patient's last menstrual period was 02/03/2015 (approximate).  Last pap: 08/07/2021 Contraception: None Mammogram: Due, last mammogram: 08/07/2021 STD Screening: Declines Flu Vaccine : Declines  CC:   Fun Fact:

## 2024-03-03 ENCOUNTER — Ambulatory Visit: Payer: Self-pay | Admitting: Obstetrics and Gynecology

## 2024-03-03 LAB — CYTOLOGY - PAP
Adequacy: ABSENT
Comment: NEGATIVE
Diagnosis: NEGATIVE
High risk HPV: NEGATIVE

## 2024-04-21 ENCOUNTER — Ambulatory Visit (INDEPENDENT_AMBULATORY_CARE_PROVIDER_SITE_OTHER): Admitting: Family Medicine

## 2024-04-21 ENCOUNTER — Encounter: Payer: Self-pay | Admitting: Family Medicine

## 2024-04-21 VITALS — BP 114/54 | HR 87 | Temp 98.0°F | Ht 65.0 in | Wt 226.2 lb

## 2024-04-21 DIAGNOSIS — R7309 Other abnormal glucose: Secondary | ICD-10-CM

## 2024-04-21 DIAGNOSIS — E782 Mixed hyperlipidemia: Secondary | ICD-10-CM | POA: Diagnosis not present

## 2024-04-21 DIAGNOSIS — E538 Deficiency of other specified B group vitamins: Secondary | ICD-10-CM

## 2024-04-21 DIAGNOSIS — F419 Anxiety disorder, unspecified: Secondary | ICD-10-CM | POA: Diagnosis not present

## 2024-04-21 DIAGNOSIS — I10 Essential (primary) hypertension: Secondary | ICD-10-CM

## 2024-04-21 DIAGNOSIS — Z0000 Encounter for general adult medical examination without abnormal findings: Secondary | ICD-10-CM | POA: Diagnosis not present

## 2024-04-21 LAB — COMPREHENSIVE METABOLIC PANEL WITH GFR
ALT: 28 U/L (ref 3–35)
AST: 23 U/L (ref 5–37)
Albumin: 4.6 g/dL (ref 3.5–5.2)
Alkaline Phosphatase: 83 U/L (ref 39–117)
BUN: 11 mg/dL (ref 6–23)
CO2: 31 meq/L (ref 19–32)
Calcium: 9.7 mg/dL (ref 8.4–10.5)
Chloride: 102 meq/L (ref 96–112)
Creatinine, Ser: 0.74 mg/dL (ref 0.40–1.20)
GFR: 89.44 mL/min
Glucose, Bld: 110 mg/dL — ABNORMAL HIGH (ref 70–99)
Potassium: 4.6 meq/L (ref 3.5–5.1)
Sodium: 140 meq/L (ref 135–145)
Total Bilirubin: 0.7 mg/dL (ref 0.2–1.2)
Total Protein: 7 g/dL (ref 6.0–8.3)

## 2024-04-21 LAB — TSH: TSH: 1.16 u[IU]/mL (ref 0.35–5.50)

## 2024-04-21 LAB — CBC WITH DIFFERENTIAL/PLATELET
Basophils Absolute: 0 K/uL (ref 0.0–0.1)
Basophils Relative: 0.6 % (ref 0.0–3.0)
Eosinophils Absolute: 0.2 K/uL (ref 0.0–0.7)
Eosinophils Relative: 2.5 % (ref 0.0–5.0)
HCT: 41.8 % (ref 36.0–46.0)
Hemoglobin: 13.7 g/dL (ref 12.0–15.0)
Lymphocytes Relative: 32 % (ref 12.0–46.0)
Lymphs Abs: 2.6 K/uL (ref 0.7–4.0)
MCHC: 32.7 g/dL (ref 30.0–36.0)
MCV: 86.2 fl (ref 78.0–100.0)
Monocytes Absolute: 0.4 K/uL (ref 0.1–1.0)
Monocytes Relative: 4.8 % (ref 3.0–12.0)
Neutro Abs: 4.8 K/uL (ref 1.4–7.7)
Neutrophils Relative %: 60.1 % (ref 43.0–77.0)
Platelets: 279 K/uL (ref 150.0–400.0)
RBC: 4.84 Mil/uL (ref 3.87–5.11)
RDW: 14.8 % (ref 11.5–15.5)
WBC: 8.1 K/uL (ref 4.0–10.5)

## 2024-04-21 LAB — HEMOGLOBIN A1C: Hgb A1c MFr Bld: 7 % — ABNORMAL HIGH (ref 4.6–6.5)

## 2024-04-21 LAB — LIPID PANEL
Cholesterol: 250 mg/dL — ABNORMAL HIGH (ref 28–200)
HDL: 46.8 mg/dL
LDL Cholesterol: 163 mg/dL — ABNORMAL HIGH (ref 10–99)
NonHDL: 203.19
Total CHOL/HDL Ratio: 5
Triglycerides: 199 mg/dL — ABNORMAL HIGH (ref 10.0–149.0)
VLDL: 39.8 mg/dL (ref 0.0–40.0)

## 2024-04-21 LAB — B12 AND FOLATE PANEL
Folate: 16.5 ng/mL
Vitamin B-12: 240 pg/mL (ref 211–911)

## 2024-04-21 MED ORDER — LISINOPRIL 20 MG PO TABS: 20.0000 mg | ORAL_TABLET | Freq: Every day | ORAL | 0 refills | Status: AC

## 2024-04-21 NOTE — Assessment & Plan Note (Signed)
 Blood pressure is at goal for age and co-morbidities.   Recommendations: continue lisinopril 20 mg daily - BP goal <130/80 - monitor and log blood pressures at home - check around the same time each day in a relaxed setting - Limit salt to <2000 mg/day - Follow DASH eating plan (heart healthy diet) - limit alcohol to 2 standard drinks per day for men and 1 per day for women - avoid tobacco products - get at least 2 hours of regular aerobic exercise weekly Patient aware of signs/symptoms requiring further/urgent evaluation. Labs updated today.

## 2024-04-21 NOTE — Progress Notes (Signed)
 "  Complete physical exam  Patient: Tara Walton   DOB: 1966/02/20   58 y.o. Female  MRN: [id number]  Subjective:    Chief Complaint  Patient presents with   Annual Exam    CPE with fasting blood panel draw    Tara Walton is a 58 y.o. female who presents today for a complete physical exam. She reports consuming a general diet. The patient does not participate in regular exercise at present.   She generally feels well. She reports sleeping well. She does not have additional problems to discuss today.   Currently lives with: spouse Acute concerns or interim problems since last visit: no  Chronic Problems  Hypertension: - Medications: Lisinopril  20 mg daily. - Compliance: good - Checking BP at home: good - Denies any SOB, recurrent headaches, CP, vision changes, LE edema, dizziness, palpitations, or medication side effects. - Diet: general  - Exercise: minimal    Mood follow-up: - Diagnosis: Anxiety - Treatment: Xanax 0.5 mg as needed - Medication side effects: none - SI/HI: no - Update: Stable. Very rare use of Xanax.   Hyperlipidemia: - medications: none - compliance: n/a - medication SEs: n/a The 10-year ASCVD risk score (Arnett DK, et al., 2019) is: 3.9%   Values used to calculate the score:     Age: 56 years     Clinically relevant sex: Female     Is Non-Hispanic African American: No     Diabetic: No     Tobacco smoker: No     Systolic Blood Pressure: 114 mmHg     Is BP treated: Yes     HDL Cholesterol: 48.8 mg/dL     Total Cholesterol: 257 mg/dL  Diabetes: - Checking glucose at home: <130 fasting  - Medications: none - Compliance: n/a - Denies symptoms of hypoglycemia, polyuria, polydipsia, numbness extremities, foot ulcers/trauma, wounds that are not healing, medication side effects  Lab Results  Component Value Date   HGBA1C 6.7 (H) 07/14/2023    Vision concerns: no Dental concerns: no STD concerns: no  ETOH use: no Nicotine use:  no Recreational drugs/illegal substances: no  Females:  She is currently  sexually active  Contraception choices are: n/a, menopausal  LMP: Patient's last menstrual period was 02/03/2015.     Most recent fall risk assessment:    04/21/2024    9:03 AM  Fall Risk   Falls in the past year? 0  Number falls in past yr: 0  Injury with Fall? 0  Risk for fall due to : No Fall Risks  Follow up Falls evaluation completed     Most recent depression screenings:    04/21/2024    9:04 AM 03/01/2024    1:14 PM  PHQ 2/9 Scores  PHQ - 2 Score 0 1  PHQ- 9 Score 2 3      Data saved with a previous flowsheet row definition            Patient Care Team: Almarie Waddell NOVAK, NP as PCP - General (Family Medicine) Dr. Delinda - [facility]) Dr. Glendia - [facility] (Dentistry)   Show/hide medication list[1]  ROS All review of systems negative except what is listed in the HPI        Objective:     BP (!) 114/54 (BP Location: Right Arm, Patient Position: Sitting, Cuff Size: Large)   Pulse 87   Temp 98 F (36.7 C) (Oral)   Ht 5' 5 (1.651 m)  Wt 226 lb 3.2 oz (102.6 kg)   LMP 02/03/2015   SpO2 97%   BMI 37.64 kg/m    Physical Exam Vitals reviewed.  Constitutional:      General: She is not in acute distress.    Appearance: Normal appearance. She is obese. She is not ill-appearing.  HENT:     Head: Normocephalic and atraumatic.     Right Ear: Tympanic membrane normal.     Left Ear: Tympanic membrane normal.     Nose: Nose normal.     Mouth/Throat:     Mouth: Mucous membranes are moist.     Pharynx: Oropharynx is clear.  Eyes:     Extraocular Movements: Extraocular movements intact.     Conjunctiva/sclera: Conjunctivae normal.     Pupils: Pupils are equal, round, and reactive to light.  Cardiovascular:     Rate and Rhythm: Normal rate and regular rhythm.     Pulses: Normal pulses.     Heart sounds: Normal heart sounds.  Pulmonary:      Effort: Pulmonary effort is normal.     Breath sounds: Normal breath sounds.  Abdominal:     General: Abdomen is flat. Bowel sounds are normal. There is no distension.     Palpations: Abdomen is soft. There is no mass.     Tenderness: There is no abdominal tenderness. There is no right CVA tenderness, left CVA tenderness, guarding or rebound.  Genitourinary:    Comments: Deferred exam Musculoskeletal:        General: Normal range of motion.     Cervical back: Normal range of motion and neck supple. No tenderness.     Right lower leg: No edema.     Left lower leg: No edema.  Lymphadenopathy:     Cervical: No cervical adenopathy.  Skin:    General: Skin is warm and dry.     Capillary Refill: Capillary refill takes less than 2 seconds.  Neurological:     General: No focal deficit present.     Mental Status: She is alert and oriented to person, place, and time. Mental status is at baseline.  Psychiatric:        Mood and Affect: Mood normal.        Behavior: Behavior normal.        Thought Content: Thought content normal.        Judgment: Judgment normal.         No results found for any visits on 04/21/24.     Assessment & Plan:    Routine Health Maintenance and Physical Exam Discussed health promotion and safety including diet and exercise recommendations, dental health, and injury prevention. Tobacco cessation if applicable. Seat belts, sunscreen, smoke detectors, etc.    Immunization History  Administered Date(s) Administered   Tdap 01/23/2011, 12/17/2019    Health Maintenance  Topic Date Due   Hepatitis B Vaccines 19-59 Average Risk (1 of 3 - 19+ 3-dose series) Never done   Colonoscopy  Never done   Pneumococcal Vaccine: 50+ Years (1 of 1 - PCV) Never done   Zoster Vaccines- Shingrix (1 of 2) Never done   Mammogram  08/08/2023   Influenza Vaccine  Never done   COVID-19 Vaccine (1 - 2025-26 season) 05/06/2024 (Originally 01/03/2024)   Cervical Cancer Screening  (HPV/Pap Cotest)  03/01/2029   DTaP/Tdap/Td (3 - Td or Tdap) 12/16/2029   Hepatitis C Screening  Completed   HIV Screening  Completed   HPV VACCINES  Aged Out  Meningococcal B Vaccine  Aged Out        Problem List Items Addressed This Visit       Active Problems   Mixed hyperlipidemia (Chronic)   -No current medications  -Check fasting lipid panel today. -Discussed diet and exercise plan to lower cholesterol.       Relevant Medications   lisinopril  (ZESTRIL ) 20 MG tablet   Other Relevant Orders   Lipid panel   Primary hypertension (Chronic)   Blood pressure is at goal for age and co-morbidities.   Recommendations: continue lisinopril  20 mg daily - BP goal <130/80 - monitor and log blood pressures at home - check around the same time each day in a relaxed setting - Limit salt to <2000 mg/day - Follow DASH eating plan (heart healthy diet) - limit alcohol  to 2 standard drinks per day for men and 1 per day for women - avoid tobacco products - get at least 2 hours of regular aerobic exercise weekly Patient aware of signs/symptoms requiring further/urgent evaluation. Labs updated today.       Relevant Medications   lisinopril  (ZESTRIL ) 20 MG tablet   Other Relevant Orders   Comprehensive metabolic panel with GFR   Anxiety (Chronic)   Patient reports very rare Xanax use. Does not currently need a refill.        Other Visit Diagnoses       Annual physical exam    -  Primary   Relevant Orders   Hemoglobin A1c   CBC with Differential/Platelet   Comprehensive metabolic panel with GFR   Lipid panel   TSH     Elevated glucose       Relevant Orders   Hemoglobin A1c     B12 deficiency       Relevant Orders   B12 and Folate Panel          PATIENT COUNSELING:    Recommend that most people either abstain from alcohol  or drink within safe limits (<=14/week and <=4 drinks/occasion for males, <=7/weeks and <= 3 drinks/occasion for females) and that the risk for  alcohol  disorders and other health effects rises proportionally with the number of drinks per week and how often a drinker exceeds daily limits.   Diet: Recommend to adjust caloric intake to maintain or achieve ideal body weight, to reduce intake of dietary saturated fat and total fat, to limit sodium intake by avoiding high sodium foods and not adding table salt, and to maintain adequate dietary potassium and calcium preferably from fresh fruits, vegetables, and low-fat dairy products.   Emphasized the importance of regular exercise.  Injury prevention: Recommend seatbelts, safety helmets, smoke detector, etc..   Dental health: Recommend regular tooth brushing, flossing, and dental visits.       Return for - pending results or sooner if needed, CPE 1 year.     Waddell KATHEE Mon, NP  I,Emily Lagle,acting as a scribe for Waddell KATHEE Mon, NP.,have documented all relevant documentation on the behalf of Waddell KATHEE Mon, NP.  I, Waddell KATHEE Mon, NP, have reviewed all documentation for this visit. The documentation on 04/21/2024 for the exam, diagnosis, procedures, and orders are all accurate and complete.     [1]  Outpatient Medications Prior to Visit  Medication Sig   ALPRAZolam (XANAX) 0.5 MG tablet Take 0.5 mg by mouth daily as needed for anxiety.   clobetasol  ointment (TEMOVATE ) 0.05 % Apply to affected area every night for 4 weeks   triamcinolone  (KENALOG ) 0.025 %  ointment Apply 1 Application topically 2 (two) times daily.   [DISCONTINUED] lisinopril  (ZESTRIL ) 20 MG tablet Take 1 tablet (20 mg total) by mouth daily. APPT FOR FURTHER REFILLS   No facility-administered medications prior to visit.   "

## 2024-04-21 NOTE — Assessment & Plan Note (Signed)
-  No current medications  -Check fasting lipid panel today. -Discussed diet and exercise plan to lower cholesterol.

## 2024-04-21 NOTE — Assessment & Plan Note (Signed)
 Patient reports very rare Xanax use. Does not currently need a refill.

## 2024-04-24 ENCOUNTER — Ambulatory Visit: Payer: Self-pay | Admitting: Family Medicine

## 2024-04-24 DIAGNOSIS — E119 Type 2 diabetes mellitus without complications: Secondary | ICD-10-CM

## 2024-04-26 MED ORDER — METFORMIN HCL 500 MG PO TABS: 500.0000 mg | ORAL_TABLET | Freq: Two times a day (BID) | ORAL | 3 refills | Status: AC

## 2024-05-01 ENCOUNTER — Ambulatory Visit

## 2024-05-09 ENCOUNTER — Encounter: Payer: Self-pay | Admitting: Family Medicine

## 2024-05-22 ENCOUNTER — Encounter: Payer: Self-pay | Admitting: Family Medicine

## 2024-05-26 ENCOUNTER — Encounter: Payer: Self-pay | Admitting: Family Medicine

## 2024-05-26 DIAGNOSIS — R197 Diarrhea, unspecified: Secondary | ICD-10-CM
